# Patient Record
Sex: Male | Born: 1975 | Race: White | Hispanic: No | Marital: Single | Smoking: Former smoker
Health system: Southern US, Community
[De-identification: ages and names within clinical notes are randomized; demographics above are authoritative.]

## PROBLEM LIST (undated history)

## (undated) DIAGNOSIS — N200 Calculus of kidney: Secondary | ICD-10-CM

## (undated) DIAGNOSIS — K219 Gastro-esophageal reflux disease without esophagitis: Secondary | ICD-10-CM

## (undated) DIAGNOSIS — E785 Hyperlipidemia, unspecified: Secondary | ICD-10-CM

## (undated) DIAGNOSIS — R42 Dizziness and giddiness: Secondary | ICD-10-CM

## (undated) DIAGNOSIS — I2510 Atherosclerotic heart disease of native coronary artery without angina pectoris: Secondary | ICD-10-CM

## (undated) HISTORY — PX: CARDIAC CATHETERIZATION: SHX172

## (undated) HISTORY — PX: WISDOM TOOTH EXTRACTION: SHX21

## (undated) HISTORY — DX: Dizziness and giddiness: R42

---

## 2013-01-26 ENCOUNTER — Emergency Department (HOSPITAL_COMMUNITY)
Admission: EM | Admit: 2013-01-26 | Discharge: 2013-01-26 | Discharge status: Against Medical Advice | Payer: Self-pay | Attending: Emergency Medicine | Admitting: Emergency Medicine

## 2013-01-26 ENCOUNTER — Encounter (HOSPITAL_COMMUNITY): Payer: Self-pay | Admitting: *Deleted

## 2013-01-26 DIAGNOSIS — F17200 Nicotine dependence, unspecified, uncomplicated: Secondary | ICD-10-CM | POA: Insufficient documentation

## 2013-01-26 DIAGNOSIS — R1110 Vomiting, unspecified: Secondary | ICD-10-CM | POA: Insufficient documentation

## 2013-01-26 HISTORY — DX: Gastro-esophageal reflux disease without esophagitis: K21.9

## 2013-01-26 NOTE — ED Notes (Signed)
Call no answer 

## 2013-01-26 NOTE — ED Notes (Signed)
Call no answer 2

## 2013-01-26 NOTE — ED Notes (Signed)
Pt states he woke up vomiting and has vomited 10-12 times.  Denies abdominal pain, diarrhea, or urinary problems

## 2013-01-26 NOTE — ED Notes (Signed)
Patient refused to get blood draw her in triage.Nurse was informed.

## 2013-01-27 ENCOUNTER — Encounter (HOSPITAL_COMMUNITY): Payer: Self-pay | Admitting: *Deleted

## 2013-01-27 ENCOUNTER — Emergency Department (HOSPITAL_COMMUNITY)
Admission: EM | Admit: 2013-01-27 | Discharge: 2013-01-27 | Disposition: A | Discharge status: Home / Self Care | Payer: Self-pay | Attending: Emergency Medicine | Admitting: Emergency Medicine

## 2013-01-27 DIAGNOSIS — K219 Gastro-esophageal reflux disease without esophagitis: Secondary | ICD-10-CM | POA: Insufficient documentation

## 2013-01-27 DIAGNOSIS — K529 Noninfective gastroenteritis and colitis, unspecified: Secondary | ICD-10-CM

## 2013-01-27 DIAGNOSIS — Z79899 Other long term (current) drug therapy: Secondary | ICD-10-CM | POA: Insufficient documentation

## 2013-01-27 DIAGNOSIS — K5289 Other specified noninfective gastroenteritis and colitis: Secondary | ICD-10-CM | POA: Insufficient documentation

## 2013-01-27 DIAGNOSIS — R197 Diarrhea, unspecified: Secondary | ICD-10-CM | POA: Insufficient documentation

## 2013-01-27 MED ORDER — SODIUM CHLORIDE 0.9 % IV BOLUS (SEPSIS): 1000.0000 mL | Freq: Once | INTRAVENOUS | Status: DC

## 2013-01-27 MED ORDER — ONDANSETRON HCL 4 MG PO TABS: 4.0000 mg | ORAL_TABLET | Freq: Four times a day (QID) | ORAL | Status: DC

## 2013-01-27 MED ORDER — ONDANSETRON HCL 4 MG/2ML IJ SOLN: 4.0000 mg | Freq: Once | INTRAMUSCULAR | Status: DC

## 2013-01-27 NOTE — ED Notes (Signed)
Pt reports him and kids had n/v yesterday, he came to be seen yesterday but had to leave ama. Also having diarrhea now. No acute distress noted at this time.

## 2013-01-27 NOTE — ED Provider Notes (Signed)
History     CSN: [id number]  Arrival date & time 01/27/13  4098   First MD Initiated Contact with Patient 01/27/13 [phone]      Chief Complaint  Patient presents with  . Emesis  . Diarrhea    (Consider location/radiation/quality/duration/timing/severity/associated sxs/prior treatment) HPI  37 year old male presents complaining of nausea vomiting and diarrhea. Patient reports for the past 2 days he has developed gradual onset of nausea vomiting and diarrhea. Has had 10-12 bouts of nonbloody nonbilious vomit since yesterday which has improved. Today he has had 3 separate bouts of nonbloody non-mucous diarrhea. He experienced some uneasiness sensation in his stomach but denies any significant pain. Patient denies fever, chills, rash, numbness or weakness. He denies any lightheadedness or dizziness. No recent antibiotic use, no recent travel. States his daughter and son has the same stomach bug but appearing to be improving. He is able to tolerates fluid but afraid to eat. Has history of gastric reflux and currently taking Prilosec.  Past Medical History  Diagnosis Date  . Acid reflux     History reviewed. No pertinent past surgical history.  History reviewed. No pertinent family history.  History  Substance Use Topics  . Smoking status: Current Every Day Smoker  . Smokeless tobacco: Not on file  . Alcohol Use: No      Review of Systems  Constitutional:       A complete 10 system review of systems was obtained and all systems are negative except as noted in the HPI and PMH.    Allergies  Codeine  Home Medications   Current Outpatient Rx  Name  Route  Sig  Dispense  Refill  . omeprazole (PRILOSEC) 20 MG capsule   Oral   Take 20 mg by mouth every morning.           BP 141/99  Pulse 94  Temp(Src) 98 F (36.7 C) (Oral)  Resp 18  SpO2 100%  Physical Exam  Nursing note and vitals reviewed. Constitutional: He is oriented to person, place, and time. He appears  well-developed and well-nourished. No distress.  Awake, alert, nontoxic appearance  HENT:  Head: Atraumatic.  Mouth/Throat: Oropharynx is clear and moist.  Eyes: Conjunctivae are normal. Right eye exhibits no discharge. Left eye exhibits no discharge.  Neck: Normal range of motion. Neck supple.  Cardiovascular: Normal rate and regular rhythm.   Pulmonary/Chest: Effort normal. No respiratory distress. He exhibits no tenderness.  Abdominal: Soft. There is no tenderness. There is no rebound.  Musculoskeletal: He exhibits no tenderness.  ROM appears intact, no obvious focal weakness  Neurological: He is alert and oriented to person, place, and time.  Skin: Skin is warm and dry. No rash noted.  Psychiatric: He has a normal mood and affect.    ED Course  Procedures (including critical care time)  9:23 AM Patient has symptoms suggestive of gastroenteritis, likely viral. He is afebrile with stable normal vital sign. There is no red flags. His abdomen is nontender on exam. I offer patient IV fluid however he declined. States he would just like he checks to make sure there is no concerning finding. Patient is able to tolerates by mouth. Patient will be discharged with antinausea medication, and return precautions. All questions answered to patient's satisfaction. Patient is stable for discharge.  Labs Reviewed - No data to display No results found.   1. Gastroenteritis       MDM  BP 141/99  Pulse 94  Temp(Src) 98 F (36.7 C) (  Oral)  Resp 18  SpO2 100%  I have reviewed nursing notes and vital signs.  I reviewed available ER/hospitalization records thought the EMR         Fayrene Helper, [place] 01/27/13 4098

## 2013-01-27 NOTE — ED Provider Notes (Signed)
Medical screening examination/treatment/procedure(s) were performed by non-physician practitioner and as supervising physician I was immediately available for consultation/collaboration.   Carleene Cooper III, MD 01/27/13 2123

## 2013-03-28 ENCOUNTER — Encounter (HOSPITAL_COMMUNITY): Payer: Self-pay | Admitting: *Deleted

## 2013-03-28 ENCOUNTER — Emergency Department (HOSPITAL_COMMUNITY)
Admission: EM | Admit: 2013-03-28 | Discharge: 2013-03-28 | Disposition: A | Discharge status: Home / Self Care | Payer: Self-pay | Attending: Emergency Medicine | Admitting: Emergency Medicine

## 2013-03-28 DIAGNOSIS — F17200 Nicotine dependence, unspecified, uncomplicated: Secondary | ICD-10-CM | POA: Insufficient documentation

## 2013-03-28 DIAGNOSIS — Z79899 Other long term (current) drug therapy: Secondary | ICD-10-CM | POA: Insufficient documentation

## 2013-03-28 DIAGNOSIS — L0231 Cutaneous abscess of buttock: Secondary | ICD-10-CM | POA: Insufficient documentation

## 2013-03-28 MED ORDER — CEPHALEXIN 500 MG PO CAPS: 500.0000 mg | ORAL_CAPSULE | Freq: Two times a day (BID) | ORAL | Status: DC

## 2013-03-28 MED ORDER — SULFAMETHOXAZOLE-TRIMETHOPRIM 800-160 MG PO TABS: 2.0000 | ORAL_TABLET | Freq: Two times a day (BID) | ORAL | Status: AC

## 2013-03-28 NOTE — ED Provider Notes (Signed)
History    This chart was scribed for non-physician practitioner, Lottie Mussel, PA-C, working with Geoffery Lyons, MD by Melba Coon, ED Scribe. This patient was seen in room TR11C/TR11C and the patient's care was started at 8:49PM.   CSN: [id number]  Arrival date & time 03/28/13  2021   None     Chief Complaint  Patient presents with  . Wound Infection    (Consider location/radiation/quality/duration/timing/severity/associated sxs/prior treatment) The history is provided by the patient. No language interpreter was used.   HPI Comments: Kevin Lloyd is a 37 y.o. male who presents to the Emergency Department complaining of an abscess to the right buttock with redness and swelling with an onset 2 days ago. Nevaan reports that he only wants antibiotics and does not want an I&D; he is very anxious about this. He reports he has had a similar abscess in the past to the same area and reports it went away with warm compresses and applied pressure to help drain it. He is doing similar things to his present abscesses and reports clear drainage mixed with some blood. He reports the abscess has not gotten larger since earlier today. He is allergic to zithromax. No other pertinent medical symptoms.  Past Medical History  Diagnosis Date  . Acid reflux     No past surgical history on file.  No family history on file.  History  Substance Use Topics  . Smoking status: Current Every Day Smoker  . Smokeless tobacco: Not on file  . Alcohol Use: No      Review of Systems  Constitutional: Negative for fever and diaphoresis.  HENT: Negative for neck pain and neck stiffness.   Eyes: Negative for visual disturbance.  Respiratory: Negative for apnea, chest tightness and shortness of breath.   Cardiovascular: Negative for chest pain and palpitations.  Gastrointestinal: Negative for nausea, vomiting, diarrhea and constipation.  Genitourinary: Negative for dysuria.  Musculoskeletal:  Negative for gait problem.  Skin: Positive for wound. Negative for rash.  Neurological: Negative for dizziness, weakness, light-headedness, numbness and headaches.  All other systems reviewed and are negative.    Allergies  Codeine  Home Medications   Current Outpatient Rx  Name  Route  Sig  Dispense  Refill  . naproxen sodium (ANAPROX) 220 MG tablet   Oral   Take 660 mg by mouth daily as needed (for headache).         Marland Kitchen omeprazole (PRILOSEC) 20 MG capsule   Oral   Take 20 mg by mouth every morning.           BP 149/80  Pulse 139  Temp(Src) 98.9 F (37.2 C) (Oral)  Resp 16  SpO2 98%  Physical Exam  Nursing note and vitals reviewed. Constitutional: He is oriented to person, place, and time. He appears well-developed and well-nourished. No distress.  HENT:  Head: Normocephalic and atraumatic.  Eyes: Conjunctivae are normal.  Neck: Normal range of motion. Neck supple.  Cardiovascular: Tachycardia present.   Pulmonary/Chest: Effort normal. No respiratory distress.  Abdominal: Soft. Bowel sounds are normal. He exhibits no distension. There is no tenderness. There is no rebound and no guarding.  Musculoskeletal: Normal range of motion. He exhibits no edema and no tenderness.  Neurological: He is alert and oriented to person, place, and time.  Skin: Skin is warm and dry. He is not diaphoretic.  3x3 cm abscess to the right buttock with erythema that is spontaneously draining with mild surrounding cellulitis  Psychiatric: He has a  normal mood and affect. His behavior is normal.    ED Course  Procedures (including critical care time)  COORDINATION OF CARE:  8:53PM - I&D is recommended but he is going against medical advice and only wants antibiotics. Antibiotics will be prescribed. He is advised to f/u with ED or PCP if symptoms worsen (increased area of redness, pain, or size). He is ready for d/c.  9:05PM - HR at time of triage was 139. HR will be  rechecked.   Labs Reviewed - No data to display No results found.   1. Abscess of right buttock       MDM  Pt with right buttock abscess, draining spontaneously. Refusing I&D, strongly recommended, still refused. Will start on keflex and bactrim. Continue wound care and warm soaks at home. Follow up with pcp. Return if worsening.   Filed Vitals:   03/28/13 2110  BP: 139/88  Pulse: 81  Temp: 99.4 F (37.4 C)  Resp: 20    I personally performed the services described in this documentation, which was scribed in my presence. The recorded information has been reviewed and is accurate.        Lottie Mussel, PA-C 03/28/13 2212

## 2013-03-28 NOTE — ED Notes (Signed)
Pt c/o abscess on right buttock. Area with swelling and redness

## 2013-03-29 NOTE — ED Provider Notes (Signed)
Medical screening examination/treatment/procedure(s) were performed by non-physician practitioner and as supervising physician I was immediately available for consultation/collaboration.  Yosiel Thieme, MD 03/29/13 0817 

## 2013-11-26 DIAGNOSIS — Z0000 Encounter for general adult medical examination without abnormal findings: Secondary | ICD-10-CM | POA: Insufficient documentation

## 2014-10-23 ENCOUNTER — Encounter (HOSPITAL_COMMUNITY): Payer: Self-pay | Admitting: Emergency Medicine

## 2014-10-23 ENCOUNTER — Emergency Department (HOSPITAL_COMMUNITY)
Admission: EM | Admit: 2014-10-23 | Discharge: 2014-10-23 | Disposition: A | Discharge status: Home / Self Care | Payer: Self-pay | Attending: Emergency Medicine | Admitting: Emergency Medicine

## 2014-10-23 ENCOUNTER — Emergency Department (HOSPITAL_COMMUNITY): Payer: Self-pay

## 2014-10-23 DIAGNOSIS — Z720 Tobacco use: Secondary | ICD-10-CM | POA: Insufficient documentation

## 2014-10-23 DIAGNOSIS — Y998 Other external cause status: Secondary | ICD-10-CM | POA: Insufficient documentation

## 2014-10-23 DIAGNOSIS — R52 Pain, unspecified: Secondary | ICD-10-CM

## 2014-10-23 DIAGNOSIS — W1839XA Other fall on same level, initial encounter: Secondary | ICD-10-CM | POA: Insufficient documentation

## 2014-10-23 DIAGNOSIS — K219 Gastro-esophageal reflux disease without esophagitis: Secondary | ICD-10-CM | POA: Insufficient documentation

## 2014-10-23 DIAGNOSIS — Z791 Long term (current) use of non-steroidal anti-inflammatories (NSAID): Secondary | ICD-10-CM | POA: Insufficient documentation

## 2014-10-23 DIAGNOSIS — Y9389 Activity, other specified: Secondary | ICD-10-CM | POA: Insufficient documentation

## 2014-10-23 DIAGNOSIS — Y9289 Other specified places as the place of occurrence of the external cause: Secondary | ICD-10-CM | POA: Insufficient documentation

## 2014-10-23 DIAGNOSIS — S4351XA Sprain of right acromioclavicular joint, initial encounter: Secondary | ICD-10-CM | POA: Insufficient documentation

## 2014-10-23 DIAGNOSIS — Z79899 Other long term (current) drug therapy: Secondary | ICD-10-CM | POA: Insufficient documentation

## 2014-10-23 MED ORDER — OXYCODONE-ACETAMINOPHEN 5-325 MG PO TABS
2.0000 | ORAL_TABLET | Freq: Once | ORAL | Status: AC
  Administered 2014-10-23: 2 via ORAL
  Filled 2014-10-23: qty 2

## 2014-10-23 MED ORDER — OXYCODONE-ACETAMINOPHEN 5-325 MG PO TABS: 1.0000 | ORAL_TABLET | ORAL | Status: DC | PRN

## 2014-10-23 MED ORDER — IBUPROFEN 200 MG PO TABS
600.0000 mg | ORAL_TABLET | Freq: Once | ORAL | Status: AC
  Administered 2014-10-23: 600 mg via ORAL
  Filled 2014-10-23: qty 3

## 2014-10-23 NOTE — ED Notes (Signed)
Pt reports that he fell, injuring his R shoulder. Pt noted to have R shoulder lower than his R, decreased movement to R arm noted. Pt has +warmth, color, sensation and pulse to R hand and fingers but reports tingling in his ring and pinky finger. Pt a&ox4, no LOC.

## 2014-10-23 NOTE — Discharge Instructions (Signed)
Acromioclavicular Injuries °The AC (acromioclavicular) joint is the joint in the shoulder where the collarbone (clavicle) meets the shoulder blade (scapula). The part of the shoulder blade connected to the collarbone is called the acromion. Common problems with and treatments for the AC joint are detailed below. °ARTHRITIS °Arthritis occurs when the joint has been injured and the smooth padding between the joints (cartilage) is lost. This is the wear and tear seen in most joints of the body if they have been overused. This causes the joint to produce pain and swelling which is worse with activity.  °AC JOINT SEPARATION °AC joint separation means that the ligaments connecting the acromion of the shoulder blade and collarbone have been damaged, and the two bones no longer line up. AC separations can be anywhere from mild to severe, and are "graded" depending upon which ligaments are torn and how badly they are torn. °· Grade I Injury: the least damage is done, and the AC joint still lines up. °· Grade II Injury: damage to the ligaments which reinforce the AC joint. In a Grade II injury, these ligaments are stretched but not entirely torn. When stressed, the AC joint becomes painful and unstable. °· Grade III Injury: AC and secondary ligaments are completely torn, and the collarbone is no longer attached to the shoulder blade. This results in deformity; a prominence of the end of the clavicle. °AC JOINT FRACTURE °AC joint fracture means that there has been a break in the bones of the AC joint, usually the end of the clavicle. °TREATMENT °TREATMENT OF AC ARTHRITIS °· There is currently no way to replace the cartilage damaged by arthritis. The best way to improve the condition is to decrease the activities which aggravate the problem. Application of ice to the joint helps decrease pain and soreness (inflammation). The use of non-steroidal anti-inflammatory medication is helpful. °· If less conservative measures do not  work, then cortisone shots (injections) may be used. These are anti-inflammatories; they decrease the soreness in the joint and swelling. °· If non-surgical measures fail, surgery may be recommended. The procedure is generally removal of a portion of the end of the clavicle. This is the part of the collarbone closest to your acromion which is stabilized with ligaments to the acromion of the shoulder blade. This surgery may be performed using a tube-like instrument with a light (arthroscope) for looking into a joint. It may also be performed as an open surgery through a small incision by the surgeon. Most patients will have good range of motion within 6 weeks and may return to all activity including sports by 8-12 weeks, barring complications. °TREATMENT OF AN AC SEPARATION °· The initial treatment is to decrease pain. This is best accomplished by immobilizing the arm in a sling and placing an ice pack to the shoulder for 20 to 30 minutes every 2 hours as needed. As the pain starts to subside, it is important to begin moving the fingers, wrist, elbow and eventually the shoulder in order to prevent a stiff or "frozen" shoulder. Instruction on when and how much to move the shoulder will be provided by your caregiver. The length of time needed to regain full motion and function depends on the amount or grade of the injury. Recovery from a Grade I AC separation usually takes 10 to 14 days, whereas a Grade III may take 6 to 8 weeks. °· Grade I and II separations usually do not require surgery. Even Grade III injuries usually allow return to full   activity with few restrictions. Treatment is also based on the activity demands of the injured shoulder. For example, a high level quarterback with an injured throwing arm will receive more aggressive treatment than someone with a desk job who rarely uses his/her arm for strenuous activities. In some cases, a painful lump may persist which could require a later surgery. Surgery  can be very successful, but the benefits must be weighed against the potential risks. °TREATMENT OF AN AC JOINT FRACTURE °Fracture treatment depends on the type of fracture. Sometimes a splint or sling may be all that is required. Other times surgery may be required for repair. This is more frequently the case when the ligaments supporting the clavicle are completely torn. Your caregiver will help you with these decisions and together you can decide what will be the best treatment. °HOME CARE INSTRUCTIONS  °· Apply ice to the injury for 15-20 minutes each hour while awake for 2 days. Put the ice in a plastic bag and place a towel between the bag of ice and skin. °· If a sling has been applied, wear it constantly for as long as directed by your caregiver, even at night. The sling or splint can be removed for bathing or showering or as directed. Be sure to keep the shoulder in the same place as when the sling is on. Do not lift the arm. °· If a figure-of-eight splint has been applied it should be tightened gently by another person every day. Tighten it enough to keep the shoulders held back. Allow enough room to place the index finger between the body and strap. Loosen the splint immediately if there is numbness or tingling in the hands. °· Take over-the-counter or prescription medicines for pain, discomfort or fever as directed by your caregiver. °· If you or your child has received a follow up appointment, it is very important to keep that appointment in order to avoid long term complications, chronic pain or disability. °SEEK MEDICAL CARE IF:  °· The pain is not relieved with medications. °· There is increased swelling or discoloration that continues to get worse rather than better. °· You or your child has been unable to follow up as instructed. °· There is progressive numbness and tingling in the arm, forearm or hand. °SEEK IMMEDIATE MEDICAL CARE IF:  °· The arm is numb, cold or pale. °· There is increasing pain  in the hand, forearm or fingers. °MAKE SURE YOU:  °· Understand these instructions. °· Will watch your condition. °· Will get help right away if you are not doing well or get worse. °Document Released: 07/24/2005 Document Revised: 01/06/2012 Document Reviewed: 01/16/2009 °[employer]® Patient Information ©2015 [employer]. This information is not intended to replace advice given to you by your health care provider. Make sure you discuss any questions you have with your health care provider. ° °

## 2014-10-29 NOTE — ED Provider Notes (Addendum)
CSN: [id number]     Arrival date & time 10/23/14  2211 History   First MD Initiated Contact with Patient 10/23/14 2229     Chief Complaint  Patient presents with  . Shoulder Injury     (Consider location/radiation/quality/duration/timing/severity/associated sxs/prior Treatment) HPI   39 six-year-old male with right shoulder pain. Patient fell just before arrival. He fell onto his right side. Persistent pain in his right shoulder since then. Increase with range of motion. Denies significant pain anywhere else. Some tingling sensation intermittently in his ring and pinky fingers. No elbow pain. No intervention prior to arrival.  Past Medical History  Diagnosis Date  . Acid reflux    History reviewed. No pertinent past surgical history. History reviewed. No pertinent family history. History  Substance Use Topics  . Smoking status: Current Every Day Smoker -- 1.00 packs/day    Types: Cigarettes  . Smokeless tobacco: Never Used  . Alcohol Use: No    Review of Systems  All systems reviewed and negative, other than as noted in HPI.   Allergies  Codeine and Azithromycin  Home Medications   Prior to Admission medications   Medication Sig Start Date End Date Taking? Authorizing Provider  cephALEXin (KEFLEX) 500 MG capsule Take 1 capsule (500 mg total) by mouth 2 (two) times daily. Patient not taking: Reported on 10/23/2014 03/28/13   Tatyana A Kirichenko, PA-C  naproxen sodium (ANAPROX) 220 MG tablet Take 660 mg by mouth daily as needed (for headache).    Historical Provider, MD  omeprazole (PRILOSEC) 20 MG capsule Take 20 mg by mouth every morning.    Historical Provider, MD  oxyCODONE-acetaminophen (PERCOCET/ROXICET) 5-325 MG per tablet Take 1-2 tablets by mouth every 4 (four) hours as needed for severe pain. 10/23/14   Raeford Razor, MD   BP 128/92 mmHg  Pulse 88  Temp(Src) 98 F (36.7 C) (Oral)  Resp 18  Ht 5' 9.5" (1.765 m)  Wt 205 lb (92.987 kg)  BMI 29.85 kg/m2   SpO2 100% Physical Exam  Constitutional: He appears well-developed and well-nourished. No distress.  HENT:  Head: Normocephalic and atraumatic.  Eyes: Conjunctivae are normal. Right eye exhibits no discharge. Left eye exhibits no discharge.  Neck: Neck supple.  Cardiovascular: Normal rate, regular rhythm and normal heart sounds.  Exam reveals no gallop and no friction rub.   No murmur heard. Pulmonary/Chest: Effort normal and breath sounds normal. No respiratory distress.  Abdominal: Soft. He exhibits no distension. There is no tenderness.  Musculoskeletal: He exhibits tenderness. He exhibits no edema.  Right shoulder is symmetric as compared to the left. No deformity appreciated. Patient has exquisite point tenderness over the right before meals joint. Can actively range her shoulder although with increased pain. Neurovascularly intact distally.  Neurological: He is alert.  Skin: Skin is warm and dry.  Psychiatric: He has a normal mood and affect. His behavior is normal. Thought content normal.  Nursing note and vitals reviewed.   ED Course  Procedures (including critical care time) Labs Review Labs Reviewed - No data to display  Imaging Review No results found.   Dg Shoulder Right  10/23/2014   CLINICAL DATA:  Fall from 3 foot ladder today. Fell on the right shoulder. Right shoulder and neck pain.  EXAM: RIGHT SHOULDER - 2+ VIEW  COMPARISON:  None.  FINDINGS: There is no evidence of fracture or dislocation. There is no evidence of arthropathy or other focal bone abnormality. Soft tissues are unremarkable.  IMPRESSION: Negative.  Electronically Signed   By: Charlett Nose M.D.   On: 10/23/2014 23:05    EKG Interpretation None      MDM   Final diagnoses:  Pain  Acromioclavicular sprain, right, initial encounter    39 year old male with right shoulder pain. Point tenderness over the right before meals joint. X-ray without any acute abnormality. Plan symptomatically treatment  as needed pain medication rest and sling. Return precautions were discussed. Orthopedic follow-up for continued symptoms.      Raeford Razor, MD 10/29/14 [phone]

## 2014-11-05 ENCOUNTER — Emergency Department (HOSPITAL_BASED_OUTPATIENT_CLINIC_OR_DEPARTMENT_OTHER)
Admission: EM | Admit: 2014-11-05 | Discharge: 2014-11-05 | Disposition: A | Discharge status: Home / Self Care | Payer: Self-pay

## 2014-11-05 NOTE — ED Notes (Signed)
Did not need to check-in, only needed note

## 2014-11-06 ENCOUNTER — Emergency Department (HOSPITAL_BASED_OUTPATIENT_CLINIC_OR_DEPARTMENT_OTHER)
Admission: EM | Admit: 2014-11-06 | Discharge: 2014-11-06 | Disposition: A | Discharge status: Home / Self Care | Payer: Self-pay | Attending: Emergency Medicine | Admitting: Emergency Medicine

## 2014-11-06 ENCOUNTER — Emergency Department (HOSPITAL_BASED_OUTPATIENT_CLINIC_OR_DEPARTMENT_OTHER): Payer: Self-pay

## 2014-11-06 ENCOUNTER — Encounter (HOSPITAL_BASED_OUTPATIENT_CLINIC_OR_DEPARTMENT_OTHER): Payer: Self-pay | Admitting: Emergency Medicine

## 2014-11-06 DIAGNOSIS — K219 Gastro-esophageal reflux disease without esophagitis: Secondary | ICD-10-CM | POA: Insufficient documentation

## 2014-11-06 DIAGNOSIS — Z792 Long term (current) use of antibiotics: Secondary | ICD-10-CM | POA: Insufficient documentation

## 2014-11-06 DIAGNOSIS — Z791 Long term (current) use of non-steroidal anti-inflammatories (NSAID): Secondary | ICD-10-CM | POA: Insufficient documentation

## 2014-11-06 DIAGNOSIS — M25511 Pain in right shoulder: Secondary | ICD-10-CM | POA: Insufficient documentation

## 2014-11-06 DIAGNOSIS — R52 Pain, unspecified: Secondary | ICD-10-CM

## 2014-11-06 DIAGNOSIS — Z720 Tobacco use: Secondary | ICD-10-CM | POA: Insufficient documentation

## 2014-11-06 MED ORDER — IBUPROFEN 800 MG PO TABS
800.0000 mg | ORAL_TABLET | Freq: Once | ORAL | Status: AC
  Administered 2014-11-06: 800 mg via ORAL
  Filled 2014-11-06: qty 1

## 2014-11-06 NOTE — ED Notes (Addendum)
Patient here yesterday requesting note to return to work. PA assessed at triage and patient denied all pain with ROM and palpation.  Decided he did not need to be treated. Note provided to return to work.

## 2014-11-06 NOTE — Discharge Instructions (Signed)
Shoulder Sprain °A shoulder sprain is the result of damage to the tough, fiber-like tissues (ligaments) that help hold your shoulder in place. The ligaments may be stretched or torn. Besides the main shoulder joint (the ball and socket), there are several smaller joints that connect the bones in this area. A sprain usually involves one of those joints. Most often it is the acromioclavicular (or AC) joint. That is the joint that connects the collarbone (clavicle) and the shoulder blade (scapula) at the top point of the shoulder blade (acromion). °A shoulder sprain is a mild form of what is called a shoulder separation. Recovering from a shoulder sprain may take some time. For some, pain lingers for several months. Most people recover without long term problems. °CAUSES  °· A shoulder sprain is usually caused by some kind of trauma. This might be: °¨ Falling on an outstretched arm. °¨ Being hit hard on the shoulder. °¨ Twisting the arm. °· Shoulder sprains are more likely to occur in people who: °¨ Play sports. °¨ Have balance or coordination problems. °SYMPTOMS  °· Pain when you move your shoulder. °· Limited ability to move the shoulder. °· Swelling and tenderness on top of the shoulder. °· Redness or warmth in the shoulder. °· Bruising. °· A change in the shape of the shoulder. °DIAGNOSIS  °Your healthcare provider may: °· Ask about your symptoms. °· Ask about recent activity that might have caused those symptoms. °· Examine your shoulder. You may be asked to do simple exercises to test movement. The other shoulder will be examined for comparison. °· Order some tests that provide a look inside the body. They can show the extent of the injury. The tests could include: °¨ X-rays. °¨ CT (computed tomography) scan. °¨ MRI (magnetic resonance imaging) scan. °RISKS AND COMPLICATIONS °· Loss of full shoulder motion. °· Ongoing shoulder pain. °TREATMENT  °How long it takes to recover from a shoulder sprain depends on how  severe it was. Treatment options may include: °· Rest. You should not use the arm or shoulder until it heals. °· Ice. For 2 or 3 days after the injury, put an ice pack on the shoulder up to 4 times a day. It should stay on for 15 to 20 minutes each time. Wrap the ice in a towel so it does not touch your skin. °· Over-the-counter medicine to relieve pain. °· A sling or brace. This will keep the arm still while the shoulder is healing. °· Physical therapy or rehabilitation exercises. These will help you regain strength and motion. Ask your healthcare provider when it is OK to begin these exercises. °· Surgery. The need for surgery is rare with a sprained shoulder, but some people may need surgery to keep the joint in place and reduce pain. °HOME CARE INSTRUCTIONS  °· Ask your healthcare provider about what you should and should not do while your shoulder heals. °· Make sure you know how to apply ice to the correct area of your shoulder. °· Talk with your healthcare provider about which medications should be used for pain and swelling. °· If rehabilitation therapy will be needed, ask your healthcare provider to refer you to a therapist. If it is not recommended, then ask about at-home exercises. Find out when exercise should begin. °SEEK MEDICAL CARE IF:  °Your pain, swelling, or redness at the joint increases. °SEEK IMMEDIATE MEDICAL CARE IF:  °· You have a fever. °· You cannot move your arm or shoulder. °Document Released: 03/02/2009 Document   Revised: 01/06/2012 Document Reviewed: 03/02/2009 °[employer]® Patient Information ©2015 [employer]. This information is not intended to replace advice given to you by your health care provider. Make sure you discuss any questions you have with your health care provider. ° °

## 2014-11-06 NOTE — ED Provider Notes (Signed)
CSN: [id number]     Arrival date & time 11/06/14  1138 History   First MD Initiated Contact with Patient 11/06/14 1254     Chief Complaint  Patient presents with  . Shoulder Injury     (Consider location/radiation/quality/duration/timing/severity/associated sxs/prior Treatment) HPI 39 year old male with recent shoulder injury at [facility] ED January 4. He returned here yesterday to obtain a note to go back to work. He states that last night at work he reinjured his right shoulder while getting something down from overhead. He did not have any direct trauma. Is complaining of pain in the anterior aspect of the right shoulder. He denies a numbness or tingling. He has some pain that radiates up towards his neck. He denies any weakness. He has not taken anything for this pain. He did not receive a prior referral and has not followed up regarding this in the past. Past Medical History  Diagnosis Date  . Acid reflux    History reviewed. No pertinent past surgical history. No family history on file. History  Substance Use Topics  . Smoking status: Current Every Day Smoker -- 1.00 packs/day    Types: Cigarettes  . Smokeless tobacco: Never Used  . Alcohol Use: No    Review of Systems  All other systems reviewed and are negative.     Allergies  Codeine and Azithromycin  Home Medications   Prior to Admission medications   Medication Sig Start Date End Date Taking? Authorizing Provider  cephALEXin (KEFLEX) 500 MG capsule Take 1 capsule (500 mg total) by mouth 2 (two) times daily. Patient not taking: Reported on 10/23/2014 03/28/13   Tatyana A Kirichenko, PA-C  naproxen sodium (ANAPROX) 220 MG tablet Take 660 mg by mouth daily as needed (for headache).    Historical Provider, MD  omeprazole (PRILOSEC) 20 MG capsule Take 20 mg by mouth every morning.    Historical Provider, MD  oxyCODONE-acetaminophen (PERCOCET/ROXICET) 5-325 MG per tablet Take 1-2 tablets by mouth every 4 (four) hours  as needed for severe pain. 10/23/14   Raeford Razor, MD   BP 123/92 mmHg  Pulse 98  Temp(Src) 98.4 F (36.9 C) (Oral)  Ht 5' 9.5" (1.765 m)  Wt 205 lb (92.987 kg)  BMI 29.85 kg/m2  SpO2 100% Physical Exam  Constitutional: He is oriented to person, place, and time. He appears well-developed and well-nourished.  HENT:  Head: Normocephalic and atraumatic.  Right Ear: External ear normal.  Left Ear: External ear normal.  Nose: Nose normal.  Mouth/Throat: Oropharynx is clear and moist.  Eyes: Conjunctivae are normal. Pupils are equal, round, and reactive to light.  Neck: Normal range of motion. Neck supple.  Cardiovascular: Normal rate and regular rhythm.   Pulmonary/Chest: Effort normal. No respiratory distress. He has no wheezes. He exhibits no tenderness.  Abdominal: Soft. He exhibits no distension and no mass. There is no tenderness. There is no guarding.  Musculoskeletal: Normal range of motion.  Right shoulder with some tenderness to palpation anterior aspect. Patient is holding arm tray down to side. He has some decreased external rotation abduction. Sensation is intact, pulses are intact. No tenderness is noted over a c joint.  Neurological: He is alert and oriented to person, place, and time. He has normal reflexes. He exhibits normal muscle tone. Coordination normal.  Skin: Skin is warm and dry.  Psychiatric: He has a normal mood and affect. His behavior is normal. Judgment and thought content normal.  Nursing note and vitals reviewed.   ED  Course  Procedures (including critical care time) Labs Review Labs Reviewed - No data to display  Imaging Review No results found.   EKG Interpretation None      MDM   Final diagnoses:  Right shoulder pain        Hilario Quarryanielle S Hansford Hirt, MD 11/06/14 [phone]

## 2014-11-06 NOTE — ED Notes (Addendum)
Pt presents to ED with complaints of right shoulder pain after lifted boxes at work last night.  When pt was here last night PA palpated right shoulder pt states to this RN at this time " it did hurt when she pushed on it but I told her it did not because I wanted to go back to work".

## 2014-11-08 ENCOUNTER — Encounter: Payer: Self-pay | Admitting: Family Medicine

## 2014-11-08 ENCOUNTER — Ambulatory Visit (INDEPENDENT_AMBULATORY_CARE_PROVIDER_SITE_OTHER): Payer: Self-pay | Admitting: Family Medicine

## 2014-11-08 VITALS — BP 116/83 | HR 81 | Ht 69.0 in | Wt 205.0 lb

## 2014-11-08 DIAGNOSIS — S4991XA Unspecified injury of right shoulder and upper arm, initial encounter: Secondary | ICD-10-CM

## 2014-11-08 MED ORDER — OXYCODONE-ACETAMINOPHEN 5-325 MG PO TABS: 1.0000 | ORAL_TABLET | Freq: Four times a day (QID) | ORAL | Status: DC | PRN

## 2014-11-08 NOTE — Patient Instructions (Addendum)
You have an AC sprain and likely shoulder subluxation. Use sling regularly for next 2 weeks. Icing 15 minutes at a time 3-4 times a day. Percocet as needed for severe pain (no driving on this). Aleve 2 tabs twice a day with food until I see you back. Out of work in the meantime. If you feel pretty good you can return in 1 week or you can start the home exercises I showed you. Follow up with me in 2 weeks.

## 2014-11-10 ENCOUNTER — Encounter: Payer: Self-pay | Admitting: Family Medicine

## 2014-11-10 DIAGNOSIS — S4991XA Unspecified injury of right shoulder and upper arm, initial encounter: Secondary | ICD-10-CM | POA: Insufficient documentation

## 2014-11-10 NOTE — Progress Notes (Signed)
PCP: Default, Provider, MD  Subjective:   HPI: Patient is a 39 y.o. male here for right shoulder injury.  Patient reports on 10/24/14 he was taking down christmas lights when he fell from about 3 feet and landed directly on his right shoulder. Had superior pain of this shoulder at that time. Tried to work through this and went back to work. 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 Also hurt when he was bringing down a tall iron/wood table and tried to catch this from falling forward. No feels unable to use this arm. Shoulder radiographs negative in ED. Has been taking ibuprofen. Given a sling but not using currently. No prior injuries.  Past Medical History  Diagnosis Date  . Acid reflux     No current outpatient prescriptions on file prior to visit.   No current facility-administered medications on file prior to visit.    Past Surgical History  Procedure Laterality Date  . Wisdom tooth extraction      Allergies  Allergen Reactions  . Codeine Anaphylaxis    Childhood allergy  . Azithromycin Other (See Comments)    Caused his tongue to breakout with bones    History   Social History  . Marital Status: Single    Spouse Name: N/A    Number of Children: N/A  . Years of Education: N/A   Occupational History  . Not on file.   Social History Main Topics  . Smoking status: Current Every Day Smoker -- 1.00 packs/day    Types: Cigarettes  . Smokeless tobacco: Never Used  . Alcohol Use: No  . Drug Use: No  . Sexual Activity: Not on file   Other Topics Concern  . Not on file   Social History Narrative    No family history on file.  BP 116/83 mmHg  Pulse 81  Ht 5\' 9"  (1.753 m)  Wt 205 lb (92.987 kg)  BMI 30.26 kg/m2  Review of Systems: See HPI above.    Objective:  Physical Exam:  Gen: NAD  Right shoulder: No swelling, ecchymoses.  No gross deformity. TTP AC joint.  No other  tenderness. Very limited motion - 30 degrees ER, flexion, abduction - limited by pain. Pain when attempting Ananias PilgrimHawkins, Neers. Negative Yergasons. Strength 5/5 with resisted internal/external rotation.  Cannot test empty can. NV intact distally.    Assessment & Plan:  1. Right shoulder injuries - consistent with AC sprain (grade 1 or 2 based on radiographs) and shoulder subluxation with recent injuries at work.  Start with rest - sling, icing with percocet and aleve.  Out of work in meantime.  F/u in 2 weeks.  Showed him codman exercises he could start doing.

## 2014-11-10 NOTE — Assessment & Plan Note (Signed)
consistent with AC sprain (grade 1 or 2 based on radiographs) and shoulder subluxation with recent injuries at work.  Start with rest - sling, icing with percocet and aleve.  Out of work in meantime.  F/u in 2 weeks.  Showed him codman exercises he could start doing.

## 2014-11-22 ENCOUNTER — Encounter (INDEPENDENT_AMBULATORY_CARE_PROVIDER_SITE_OTHER): Payer: Self-pay

## 2014-11-22 ENCOUNTER — Ambulatory Visit (INDEPENDENT_AMBULATORY_CARE_PROVIDER_SITE_OTHER): Payer: Self-pay | Admitting: Family Medicine

## 2014-11-22 ENCOUNTER — Encounter: Payer: Self-pay | Admitting: Family Medicine

## 2014-11-22 VITALS — BP 130/86 | HR 82 | Ht 70.0 in | Wt 205.0 lb

## 2014-11-22 DIAGNOSIS — S4991XD Unspecified injury of right shoulder and upper arm, subsequent encounter: Secondary | ICD-10-CM

## 2014-11-22 NOTE — Patient Instructions (Signed)
You are improving as expected. Start theraband strengthening exercises 3 sets of 10 once a day. Avoid overhead lifting, a lot of reaching, more than 15 pounds of lifting as well. Light duty as written in letter. Follow up with me in 2-4 weeks as discussed.

## 2014-11-23 NOTE — Assessment & Plan Note (Signed)
consistent with AC sprain (grade 1 or 2 based on radiographs) and shoulder subluxation with recent injuries at work.  Much improved with conservative measures to date.  Will add theraband strengthening once a day.  Restrictions at work - weight restriction, no overhead lifting, see letter.  F/u in 2-4 weeks (as early as 2 weeks if completely improved).

## 2014-11-23 NOTE — Progress Notes (Signed)
PCP: Default, Provider, MD  Subjective:   HPI: Patient is a 39 y.o. male here for right shoulder injury.  1/12: Patient reports on 10/24/14 he was taking down christmas lights when he fell from about 3 feet and landed directly on his right shoulder. Had superior pain of this shoulder at that time. Tried to work through this and went back to work. 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 Also hurt when he was bringing down a tall iron/wood table and tried to catch this from falling forward. No feels unable to use this arm. Shoulder radiographs negative in ED. Has been taking ibuprofen. Given a sling but not using currently. No prior injuries.  1/26: Patient reports he feels better. Motion has improved and pain now only with certain movements. Doing home exercises. No longer using sling. + night pain.  Past Medical History  Diagnosis Date  . Acid reflux     Current Outpatient Prescriptions on File Prior to Visit  Medication Sig Dispense Refill  . oxyCODONE-acetaminophen (PERCOCET/ROXICET) 5-325 MG per tablet Take 1 tablet by mouth every 6 (six) hours as needed for severe pain. 60 tablet 0   No current facility-administered medications on file prior to visit.    Past Surgical History  Procedure Laterality Date  . Wisdom tooth extraction      Allergies  Allergen Reactions  . Codeine Anaphylaxis    Childhood allergy  . Azithromycin Other (See Comments)    Caused his tongue to breakout with bones    History   Social History  . Marital Status: Single    Spouse Name: N/A    Number of Children: N/A  . Years of Education: N/A   Occupational History  . Not on file.   Social History Main Topics  . Smoking status: Current Every Day Smoker -- 1.00 packs/day    Types: Cigarettes  . Smokeless tobacco: Never Used  . Alcohol Use: No  . Drug Use: No  . Sexual Activity: Not on file   Other Topics  Concern  . Not on file   Social History Narrative    No family history on file.  BP 130/86 mmHg  Pulse 82  Ht 5\' 10"  (1.778 m)  Wt 205 lb (92.987 kg)  BMI 29.41 kg/m2  Review of Systems: See HPI above.    Objective:  Physical Exam:  Gen: NAD  Right shoulder: No swelling, ecchymoses.  No gross deformity. Mild TTP AC joint.  No other tenderness. FROM - significantly improved. Mild pain with hawkins, neers Negative Yergasons. Strength 5/5 with resisted internal/external rotation, empty can. NV intact distally.    Assessment & Plan:  1. Right shoulder injuries - consistent with AC sprain (grade 1 or 2 based on radiographs) and shoulder subluxation with recent injuries at work.  Much improved with conservative measures to date.  Will add theraband strengthening once a day.  Restrictions at work - weight restriction, no overhead lifting, see letter.  F/u in 2-4 weeks (as early as 2 weeks if completely improved).

## 2015-08-07 ENCOUNTER — Emergency Department (HOSPITAL_BASED_OUTPATIENT_CLINIC_OR_DEPARTMENT_OTHER)
Admission: EM | Admit: 2015-08-07 | Discharge: 2015-08-07 | Disposition: A | Discharge status: Home / Self Care | Payer: Self-pay | Attending: Emergency Medicine | Admitting: Emergency Medicine

## 2015-08-07 ENCOUNTER — Encounter (HOSPITAL_BASED_OUTPATIENT_CLINIC_OR_DEPARTMENT_OTHER): Payer: Self-pay | Admitting: *Deleted

## 2015-08-07 DIAGNOSIS — Z720 Tobacco use: Secondary | ICD-10-CM | POA: Insufficient documentation

## 2015-08-07 DIAGNOSIS — K0381 Cracked tooth: Secondary | ICD-10-CM | POA: Insufficient documentation

## 2015-08-07 DIAGNOSIS — K029 Dental caries, unspecified: Secondary | ICD-10-CM | POA: Insufficient documentation

## 2015-08-07 DIAGNOSIS — K002 Abnormalities of size and form of teeth: Secondary | ICD-10-CM | POA: Insufficient documentation

## 2015-08-07 DIAGNOSIS — K047 Periapical abscess without sinus: Secondary | ICD-10-CM

## 2015-08-07 MED ORDER — OXYCODONE-ACETAMINOPHEN 5-325 MG PO TABS: 2.0000 | ORAL_TABLET | ORAL | Status: DC | PRN

## 2015-08-07 MED ORDER — AMOXICILLIN-POT CLAVULANATE 875-125 MG PO TABS: 1.0000 | ORAL_TABLET | Freq: Two times a day (BID) | ORAL | Status: DC

## 2015-08-07 NOTE — Discharge Instructions (Signed)
Dental Care and Dentist Visits Dental care supports good overall health. Regular dental visits can also help you avoid dental pain, bleeding, infection, and other more serious health problems in the future. It is important to keep the mouth healthy because diseases in the teeth, gums, and other oral tissues can spread to other areas of the body. Some problems, such as diabetes, heart disease, and pre-term labor have been associated with poor oral health.  See your dentist every 6 months. If you experience emergency problems such as a toothache or broken tooth, go to the dentist right away. If you see your dentist regularly, you may catch problems early. It is easier to be treated for problems in the early stages.  WHAT TO EXPECT AT A DENTIST VISIT  Your dentist will look for many common oral health problems and recommend proper treatment. At your regular dental visit, you can expect:  Gentle cleaning of the teeth and gums. This includes scraping and polishing. This helps to remove the sticky substance around the teeth and gums (plaque). Plaque forms in the mouth shortly after eating. Over time, plaque hardens on the teeth as tartar. If tartar is not removed regularly, it can cause problems. Cleaning also helps remove stains.  Periodic X-rays. These pictures of the teeth and supporting bone will help your dentist assess the health of your teeth.  Periodic fluoride treatments. Fluoride is a natural mineral shown to help strengthen teeth. Fluoride treatmentinvolves applying a fluoride gel or varnish to the teeth. It is most commonly done in children.  Examination of the mouth, tongue, jaws, teeth, and gums to look for any oral health problems, such as:  Cavities (dental caries). This is decay on the tooth caused by plaque, sugar, and acid in the mouth. It is best to catch a cavity when it is small.  Inflammation of the gums caused by plaque buildup (gingivitis).  Problems with the mouth or malformed  or misaligned teeth.  Oral cancer or other diseases of the soft tissues or jaws. KEEP YOUR TEETH AND GUMS HEALTHY For healthy teeth and gums, follow these general guidelines as well as your dentist's specific advice:  Have your teeth professionally cleaned at the dentist every 6 months.  Brush twice daily with a fluoride toothpaste.  Floss your teeth daily.  Ask your dentist if you need fluoride supplements, treatments, or fluoride toothpaste.  Eat a healthy diet. Reduce foods and drinks with added sugar.  Avoid smoking. TREATMENT FOR ORAL HEALTH PROBLEMS If you have oral health problems, treatment varies depending on the conditions present in your teeth and gums.  Your caregiver will most likely recommend good oral hygiene at each visit.  For cavities, gingivitis, or other oral health disease, your caregiver will perform a procedure to treat the problem. This is typically done at a separate appointment. Sometimes your caregiver will refer you to another dental specialist for specific tooth problems or for surgery. SEEK IMMEDIATE DENTAL CARE IF:  You have pain, bleeding, or soreness in the gum, tooth, jaw, or mouth area.  A permanent tooth becomes loose or separated from the gum socket.  You experience a blow or injury to the mouth or jaw area.   This information is not intended to replace advice given to you by your health care provider. Make sure you discuss any questions you have with your health care provider.   Follow-up with dentist as soon as possible for tooth extraction. Return to the emergency department if you expands worsening of  your symptoms, difficulty breathing, difficulty swallowing, fever

## 2015-08-07 NOTE — ED Notes (Signed)
Right molar pain x 4 days.

## 2015-08-08 NOTE — ED Provider Notes (Signed)
CSN: [id number]     Arrival date & time 08/07/15  1937 History   First MD Initiated Contact with Patient 08/07/15 2054     Chief Complaint  Patient presents with  . Dental Pain     (Consider location/radiation/quality/duration/timing/severity/associated sxs/prior Treatment) HPI  Kevin Lloyd is a 39 y.o M with no significant pmhx who presents to the Ed with dental pain for 3 days. Pt states that his R upper molar broke off a couple of months ago. Pt has not seen a dentist due to lack of insurance. Denies fever, chills, facial swelling, difficulty swallowing or breathing. Past Medical History  Diagnosis Date  . Acid reflux    Past Surgical History  Procedure Laterality Date  . Wisdom tooth extraction     No family history on file. Social History  Substance Use Topics  . Smoking status: Current Every Day Smoker -- 1.00 packs/day    Types: Cigarettes  . Smokeless tobacco: Never Used  . Alcohol Use: No    Review of Systems  All other systems reviewed and are negative.     Allergies  Codeine and Azithromycin  Home Medications   Prior to Admission medications   Medication Sig Start Date End Date Taking? Authorizing Provider  amoxicillin-clavulanate (AUGMENTIN) 875-125 MG tablet Take 1 tablet by mouth every 12 (twelve) hours. 08/07/15   Delos Klich Tripp Tonnette Zwiebel, PA-C  oxyCODONE-acetaminophen (PERCOCET/ROXICET) 5-325 MG per tablet Take 1 tablet by mouth every 6 (six) hours as needed for severe pain. 11/08/14   Lenda Kelp, MD  oxyCODONE-acetaminophen (PERCOCET/ROXICET) 5-325 MG tablet Take 2 tablets by mouth every 4 (four) hours as needed for severe pain. 08/07/15   Lottie Siska Tripp Trey Bebee, PA-C   BP 153/95 mmHg  Pulse 77  Temp(Src) 98.6 F (37 C) (Oral)  Resp 20  Ht 5' 9.5" (1.765 m)  Wt 205 lb (92.987 kg)  BMI 29.85 kg/m2  SpO2 98% Physical Exam  Constitutional: He is oriented to person, place, and time. He appears well-developed and well-nourished.  HENT:    Head: Normocephalic and atraumatic.  Mouth/Throat: Uvula is midline, oropharynx is clear and moist and mucous membranes are normal. He does not have dentures. No oral lesions. No trismus in the jaw. Abnormal dentition. Dental abscesses and dental caries present. No uvula swelling or lacerations. No oropharyngeal exudate, posterior oropharyngeal edema, posterior oropharyngeal erythema or tonsillar abscesses.    Eyes: Conjunctivae are normal. Right eye exhibits no discharge. Left eye exhibits no discharge. No scleral icterus.  Cardiovascular: Normal rate.   Pulmonary/Chest: Effort normal.  Neurological: He is alert and oriented to person, place, and time. Coordination normal.  Skin: Skin is warm and dry. No rash noted. No erythema. No pallor.  Psychiatric: He has a normal mood and affect. His behavior is normal.  Nursing note and vitals reviewed.   ED Course  Procedures (including critical care time) Labs Review Labs Reviewed - No data to display  Imaging Review No results found. I have personally reviewed and evaluated these images and lab results as part of my medical decision-making.   EKG Interpretation None      MDM   Final diagnoses:  Dental infection    Patient was presents with dental pain 3 days after breaking his molar month ago. No evidence of dental abscess or Ludwig's angina. Will treat with anabiotic or dental infection and recommend dentist follow-up.Return precautions outlined in patient discharge instructions.      Lester Kinsman Bethel, PA-C 08/08/15 0205  Leta Baptist,  MD 08/08/15 1610

## 2015-10-14 ENCOUNTER — Emergency Department (HOSPITAL_COMMUNITY)
Admission: EM | Admit: 2015-10-14 | Discharge: 2015-10-14 | Disposition: A | Discharge status: Home / Self Care | Payer: Self-pay | Attending: Emergency Medicine | Admitting: Emergency Medicine

## 2015-10-14 ENCOUNTER — Emergency Department (HOSPITAL_COMMUNITY): Payer: Self-pay

## 2015-10-14 ENCOUNTER — Encounter (HOSPITAL_COMMUNITY): Payer: Self-pay | Admitting: Emergency Medicine

## 2015-10-14 DIAGNOSIS — Y9389 Activity, other specified: Secondary | ICD-10-CM | POA: Insufficient documentation

## 2015-10-14 DIAGNOSIS — Z8719 Personal history of other diseases of the digestive system: Secondary | ICD-10-CM | POA: Insufficient documentation

## 2015-10-14 DIAGNOSIS — S3992XA Unspecified injury of lower back, initial encounter: Secondary | ICD-10-CM | POA: Insufficient documentation

## 2015-10-14 DIAGNOSIS — F17210 Nicotine dependence, cigarettes, uncomplicated: Secondary | ICD-10-CM | POA: Insufficient documentation

## 2015-10-14 DIAGNOSIS — Y92410 Unspecified street and highway as the place of occurrence of the external cause: Secondary | ICD-10-CM | POA: Insufficient documentation

## 2015-10-14 DIAGNOSIS — Y998 Other external cause status: Secondary | ICD-10-CM | POA: Insufficient documentation

## 2015-10-14 DIAGNOSIS — M62838 Other muscle spasm: Secondary | ICD-10-CM | POA: Insufficient documentation

## 2015-10-14 DIAGNOSIS — Z7951 Long term (current) use of inhaled steroids: Secondary | ICD-10-CM | POA: Insufficient documentation

## 2015-10-14 MED ORDER — IBUPROFEN 200 MG PO TABS
600.0000 mg | ORAL_TABLET | Freq: Once | ORAL | Status: AC
  Administered 2015-10-14: 600 mg via ORAL
  Filled 2015-10-14: qty 3

## 2015-10-14 MED ORDER — NAPROXEN 500 MG PO TABS: 500.0000 mg | ORAL_TABLET | Freq: Two times a day (BID) | ORAL | Status: DC

## 2015-10-14 MED ORDER — DIAZEPAM 5 MG PO TABS: 5.0000 mg | ORAL_TABLET | Freq: Two times a day (BID) | ORAL | Status: DC | PRN
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
## 2015-11-15 ENCOUNTER — Emergency Department (HOSPITAL_COMMUNITY)
Admission: EM | Admit: 2015-11-15 | Discharge: 2015-11-15 | Disposition: A | Discharge status: Home / Self Care | Payer: Worker's Compensation | Attending: Emergency Medicine | Admitting: Emergency Medicine

## 2015-11-15 ENCOUNTER — Emergency Department (HOSPITAL_COMMUNITY): Payer: Self-pay

## 2015-11-15 ENCOUNTER — Encounter (HOSPITAL_COMMUNITY): Payer: Self-pay

## 2015-11-15 DIAGNOSIS — S93431A Sprain of tibiofibular ligament of right ankle, initial encounter: Secondary | ICD-10-CM | POA: Insufficient documentation

## 2015-11-15 DIAGNOSIS — Z87891 Personal history of nicotine dependence: Secondary | ICD-10-CM | POA: Insufficient documentation

## 2015-11-15 DIAGNOSIS — Z791 Long term (current) use of non-steroidal anti-inflammatories (NSAID): Secondary | ICD-10-CM | POA: Insufficient documentation

## 2015-11-15 DIAGNOSIS — X501XXA Overexertion from prolonged static or awkward postures, initial encounter: Secondary | ICD-10-CM | POA: Insufficient documentation

## 2015-11-15 DIAGNOSIS — S93401A Sprain of unspecified ligament of right ankle, initial encounter: Secondary | ICD-10-CM

## 2015-11-15 DIAGNOSIS — Y9389 Activity, other specified: Secondary | ICD-10-CM | POA: Insufficient documentation

## 2015-11-15 DIAGNOSIS — Z7951 Long term (current) use of inhaled steroids: Secondary | ICD-10-CM | POA: Insufficient documentation

## 2015-11-15 DIAGNOSIS — Z8719 Personal history of other diseases of the digestive system: Secondary | ICD-10-CM | POA: Insufficient documentation

## 2015-11-15 DIAGNOSIS — Y990 Civilian activity done for income or pay: Secondary | ICD-10-CM | POA: Insufficient documentation

## 2015-11-15 DIAGNOSIS — Y92480 Sidewalk as the place of occurrence of the external cause: Secondary | ICD-10-CM | POA: Insufficient documentation

## 2015-11-15 MED ORDER — IBUPROFEN 200 MG PO TABS
600.0000 mg | ORAL_TABLET | Freq: Once | ORAL | Status: AC
Start: 2015-11-15 — End: 2015-11-15
  Administered 2015-11-15: 600 mg via ORAL
  Filled 2015-11-15: qty 3

## 2015-11-15 NOTE — Discharge Instructions (Signed)
Rest, Ice intermittently (in the first 24-48 hours), Gentle compression with an Ace wrap, and elevate (Limb above the level of the heart) °  °Take up to 800mg of ibuprofen (that is usually 4 over the counter pills)  3 times a day for 5 days. Take with food. ° °Do not hesitate to return to the emergency room for any new, worsening or concerning symptoms. ° °Please obtain primary care using resource guide below. Let them know that you were seen in the emergency room and that they will need to obtain records for further outpatient management. ° ° ° °Ankle Sprain °An ankle sprain is an injury to the strong, fibrous tissues (ligaments) that hold the bones of your ankle joint together.  °CAUSES °An ankle sprain is usually caused by a fall or by twisting your ankle. Ankle sprains most commonly occur when you step on the outer edge of your foot, and your ankle turns inward. People who participate in sports are more prone to these types of injuries.  °SYMPTOMS  °· Pain in your ankle. The pain may be present at rest or only when you are trying to stand or walk. °· Swelling. °· Bruising. Bruising may develop immediately or within 1 to 2 days after your injury. °· Difficulty standing or walking, particularly when turning corners or changing directions. °DIAGNOSIS  °Your caregiver will ask you details about your injury and perform a physical exam of your ankle to determine if you have an ankle sprain. During the physical exam, your caregiver will press on and apply pressure to specific areas of your foot and ankle. Your caregiver will try to move your ankle in certain ways. An X-ray exam may be done to be sure a bone was not broken or a ligament did not separate from one of the bones in your ankle (avulsion fracture).  °TREATMENT  °Certain types of braces can help stabilize your ankle. Your caregiver can make a recommendation for this. Your caregiver may recommend the use of medicine for pain. If your sprain is severe, your  caregiver may refer you to a surgeon who helps to restore function to parts of your skeletal system (orthopedist) or a physical therapist. °HOME CARE INSTRUCTIONS  °· Apply ice to your injury for 1-2 days or as directed by your caregiver. Applying ice helps to reduce inflammation and pain. °¨ Put ice in a plastic bag. °¨ Place a towel between your skin and the bag. °¨ Leave the ice on for 15-20 minutes at a time, every 2 hours while you are awake. °· Only take over-the-counter or prescription medicines for pain, discomfort, or fever as directed by your caregiver. °· Elevate your injured ankle above the level of your heart as much as possible for 2-3 days. °· If your caregiver recommends crutches, use them as instructed. Gradually put weight on the affected ankle. Continue to use crutches or a cane until you can walk without feeling pain in your ankle. °· If you have a plaster splint, wear the splint as directed by your caregiver. Do not rest it on anything harder than a pillow for the first 24 hours. Do not put weight on it. Do not get it wet. You may take it off to take a shower or bath. °· You may have been given an elastic bandage to wear around your ankle to provide support. If the elastic bandage is too tight (you have numbness or tingling in your foot or your foot becomes cold and blue), adjust the   bandage to make it comfortable. °· If you have an air splint, you may blow more air into it or let air out to make it more comfortable. You may take your splint off at night and before taking a shower or bath. Wiggle your toes in the splint several times per day to decrease swelling. °SEEK MEDICAL CARE IF:  °· You have rapidly increasing bruising or swelling. °· Your toes feel extremely cold or you lose feeling in your foot. °· Your pain is not relieved with medicine. °SEEK IMMEDIATE MEDICAL CARE IF: °· Your toes are numb or blue. °· You have severe pain that is increasing. °MAKE SURE YOU:  °· Understand these  instructions. °· Will watch your condition. °· Will get help right away if you are not doing well or get worse. °  °This information is not intended to replace advice given to you by your health care provider. Make sure you discuss any questions you have with your health care provider. °  °Document Released: 10/14/2005 Document Revised: 11/04/2014 Document Reviewed: 10/26/2011 °Elsevier Interactive Patient Education ©2016 Elsevier Inc. ° °Emergency Department Resource Guide °1) Find a Doctor and Pay Out of Pocket °Although you won't have to find out who is covered by your insurance plan, it is a good idea to ask around and get recommendations. You will then need to call the office and see if the doctor you have chosen will accept you as a new patient and what types of options they offer for patients who are self-pay. Some doctors offer discounts or will set up payment plans for their patients who do not have insurance, but you will need to ask so you aren't surprised when you get to your appointment. ° °2) Contact Your Local Health Department °Not all health departments have doctors that can see patients for sick visits, but many do, so it is worth a call to see if yours does. If you don't know where your local health department is, you can check in your phone book. The CDC also has a tool to help you locate your state's health department, and many state websites also have listings of all of their local health departments. ° °3) Find a Walk-in Clinic °If your illness is not likely to be very severe or complicated, you may want to try a walk in clinic. These are popping up all over the country in pharmacies, drugstores, and shopping centers. They're usually staffed by nurse practitioners or physician assistants that have been trained to treat common illnesses and complaints. They're usually fairly quick and inexpensive. However, if you have serious medical issues or chronic medical problems, these are probably not  your best option. ° °No Primary Care Doctor: °- Call [facility] at  832-8000 - they can help you locate a primary care doctor that  accepts your insurance, provides certain services, etc. °- Physician Referral Service- 1-800-533-3463 ° °Chronic Pain Problems: °Organization         Address  Phone   Notes  °[facility]  (336) 297-2271 Patients need to be referred by their primary care doctor.  ° °Medication Assistance: °Organization         Address  Phone   Notes  °[place] [facility] [address] °[place], [place] [address] (336) 641-8030 --Must be a resident of [place] °-- Must have NO insurance coverage whatsoever (no Medicaid/ Medicare, etc.) °-- The pt. MUST have a primary care doctor that directs their   care regularly and follows them in the community °  °[facility]  (866) 331-1348   °[employer]  (888) 892-1162   ° °Agencies that provide inexpensive medical care: °Organization         Address  Phone   Notes  °[facility]  (336) 832-8035   °[facility]    (336) 832-7272   °[facility] [address] °[place], [place] [address] (336) 832-4777   °[facility] [address], °[place] (336) 271-4999   °[facility]    (336) 373-0678   °[facility]    (336) 272-1050   °[facility] ° [address], [place] Phone:  (336) 832-4444, Fax:  (336) 832-4440 Hours of Operation:  9 am - 6 pm, M-F.  Also accepts Medicaid/Medicare and self-pay.  °[facility] ° [address], [place] Phone: (336) 832-3150, Fax: (336) 832-3151. Hours of Operation:  8:30 am - 5:30 pm, M-F.  Also accepts Medicaid and self-pay.  °[facility] [place] [address], [place] Phone: (336) 878-6027   °[facility] [address], [place], [place] (336)723-1848, Ext. 123 Mondays & Thursdays: 7-9 AM.  First  15 patients are seen on a first come, first serve basis. °  ° °Medicaid-accepting [place] Providers: ° °Organization         Address  Phone   Notes  °[facility] [address], [place] (336) 641-2100 Also accepts self-pay patients.  °[facility] [address], [place] ° (336) 856-9996   °[facility] [address], [place] (336) 288-8857   °[facility] [address] [place] [address], [place] (336) 299-7000   °Veita Bland [address], [place]  ° (336) 373-1557 Only accepts [place] Access Medicaid patients after they have their name applied to their card.  ° °Self-Pay (no insurance) in [place]: ° °Organization         Address  Phone   Notes  °Sickle Cell Patients, [facility] [address], [place] (336) 832-1970   °[facility] [address], [place] (336) 832-4400   °[facility] ° [address], [place] (336) 992-4800   °[facility]/Dr. Osei-Bonsu ° [address] [place] [address], [place] or [address], [place] (336) [phone] Phone number for both [place] and [place] locations is the same.  °[facility] [address], [place] (336) 299-0000   °[facility] [address] [place] [address], [place] or [address] (336) 852-7530 °(336) 878-2260   °[facility] [address], [place] (336) 350-1642, phone; (336) 294-5005, fax Sees patients 1st and 3rd Saturday of every month.  Must not qualify for public or private insurance (i.e. Medicaid, Medicare, [place] Health Choice, Veterans' Benefits) • Household income should be no more than 200% of the poverty level •The clinic cannot treat you if you are pregnant or think you are pregnant • Sexually transmitted diseases are not treated at the clinic.  ° ° °Dental  Care: °Organization         Address  Phone  Notes  °[facility] [address],  (336) 641-6152 Accepts children up to age   21 who are enrolled in Medicaid or [place] Health Choice; pregnant women with a Medicaid card; and children who have applied for Medicaid or [place] Health Choice, but were declined, whose parents can pay a reduced fee at time of service.  °[facility] [place]  [address], [place] (336) 641-7733 Accepts children up to age 21 who are enrolled in Medicaid or [place] Health Choice; pregnant women with a Medicaid card; and children who have applied for Medicaid or [place] Health Choice, but were declined, whose parents can pay a reduced fee at time of service.  °[facility] ° [address], [place] (336) 641-4533 Patients are seen by appointment only. Walk-ins are not accepted. [facility] will see patients 18 years of age and older. °Monday - Tuesday (8am-5pm) °Most Wednesdays (8:30-5pm) °$30 per visit, cash only  °[facility] ° [address], [place] (336) 641-4533 Patients are seen by appointment only. Walk-ins are not accepted. [facility] will see patients 18 years of age and older. °One Wednesday Evening (Monthly: Volunteer Based).  $30 per visit, cash only  °[facility]  (919) 537-3737 for adults; Children under age 4, call Graduate Pediatric Dentistry at ([phone]) [phone]. Children aged 4-14, please call (919) [phone] to request a pediatric application. ° Dental services are provided in all areas of dental care including fillings, crowns and bridges, complete and partial dentures, implants, gum treatment, root canals, and extractions. Preventive care is also provided. Treatment is provided to both adults and children. °Patients are selected via a lottery and there is often a waiting list. °  °[facility] [address], °[place] ° (336) 763-8833 [email or web address] °  °[facility] [address], [place], [place] (336)723-1848, Ext. 123 Second and Fourth Thursday of each month, opens at 6:30 AM; Clinic ends at 9 AM.  Patients are seen on a first-come first-served basis, and a limited number are seen during each clinic.  ° °[facility] ° [address], [place], [place] (336) 723-7904   Eligibility Requirements °You must have lived in [place], [place], or [place] counties for at least the last three months. °  You cannot be eligible for state or federal sponsored healthcare insurance, including Veterans Administration, Medicaid, or Medicare. °  You generally cannot be eligible for healthcare insurance through your employer.  °  How to apply: °Eligibility screenings are held every Tuesday and Wednesday afternoon from 1:00 pm until 4:00 pm. You do not need an appointment for the interview!  °[facility] [address], [place], [place] 336-631-2330   °[facility]  336-342-8273   °[facility]  336-703-3100   °[facility]  336-570-6415   ° °Behavioral Health Resources in the Community: °Intensive Outpatient Programs °Organization         Address  Phone  Notes  °[facility] [address], [place], [place] 336-878-6098   °[facility] [address], [place], [place] 336-832-9800   °[facility] [address], [place],  ° 336-882-2125   °[facility] [address],  °[place],  1-800-853-5163 or 336-641-4981   °Substance Abuse Resources °Organization         Address  Phone  Notes  °[facility]  336-882-2125   °[facility]    336-784-9470   °[facility]  336-285-9073   °[facility]  336-845-3988   °Residential & Outpatient Substance Abuse Program  1-800-659-3381    °Psychological Services °Organization         Address  Phone  Notes  °[facility]  336- 832-9600   °[facility]  336- 378-7881   °[facility] [address], [place] 1-800-853-5163 or 336-641-4981   ° °Mobile Crisis Teams °Organization         Address  Phone  Notes  °[facility], Mobile Crisis Care Unit  1-877-626-1772   °Assertive °[facility] ° [address] [place], [place] 336-834-9664   °Sharon DeEsch [address] °[place] 336-554-5454   ° °Self-Help/Support Groups °Organization         Address  Phone             Notes  °[facility] - variety of support groups  336- 373-1402 Call for more information  °Narcotics Anonymous (NA), [facility] [address], °[place]  2 meetings at this location  ° °Residential Treatment Programs °Organization         Address  Phone  Notes  °[facility] [address],    °[place]  1-866-801-8205   °[facility] ° [address], [place], [place] 704-293-8524   °[facility] [address], [place] 336-845-3988 Admissions: 8am-3pm M-F  °[facility] [address]    °[place], [place] 336-841-1104   °[facility] [address], [place], [place] 336-379-7146   °[facility] [address]  °[place], [place] 336-285-9073   °[facility] - Intensive Outpatient [address], [place], [place] 336-852-3033   °[facility] [address]  °[place], [place] 1-877-615-2722 or 336-784-9470   °[facility] [address] [place], [place] 336-227-7417 Accepts Medicaid  °[facility] [address]  °[place] 1-800-659-3381 Substance Abuse/Addiction Treatment  ° °[place] Behavioral Health Resources °Organization         Address  Phone  Notes  °[facility]  (888) 581-9988   °Julie Brannon, PhD [address] [place], [place]   (336) 349-5553 or (336) 951-0000   °[facility]   [address] °[place], [place] (336) 349-4454   °[facility] [address], [place], [place] (336) 342-8316 Insurance/Medicaid/sponsorship through [facility]  °[facility] [address]                                    [place], [place] (336) 342-8316 Therapy/tele-psych/case  °[facility] [address]  ° [place],  (336) 349-2233    °Dr. Arfeen  (336) 349-4544   °[facility] [place]  [employer] [place] [facility] 1) [address], [place] °2) [address], [place] °3)  [address], [place] (336) 349-3220 °(336) 342-7768 ° °(336) 342-8140   °[place] [facility] (336) 342-1394 or (336) 342-3537 (After Hours)    ° ° ° °

## 2015-11-15 NOTE — ED Notes (Signed)
Pt was at work. Pt rolled ankle on metal plate.  C/O rt lateral ankle pain. Swelling noted

## 2015-11-15 NOTE — ED Provider Notes (Signed)
CSN: [id number]     Arrival date & time 11/15/15  1012 History   First MD Initiated Contact with Patient 11/15/15 1021     Chief Complaint  Patient presents with  . Ankle Injury     (Consider location/radiation/quality/duration/timing/severity/associated sxs/prior Treatment) HPI   There were no vitals taken for this visit.  Kevin Lloyd is a 40 y.o. male complaining of pain to right ankle onset this morning when a plate in the sidewalk moved and he rolled the ankle. Rates his pain is severe, is not able to weight-bear. No prior surgeries, as to the affected joint. No pain medication taken prior to arrival.   Past Medical History  Diagnosis Date  . Acid reflux    Past Surgical History  Procedure Laterality Date  . Wisdom tooth extraction     History reviewed. No pertinent family history. Social History  Substance Use Topics  . Smoking status: Former Smoker -- 1.00 packs/day    Types: Cigarettes  . Smokeless tobacco: Never Used  . Alcohol Use: No    Review of Systems  10 systems reviewed and found to be negative, except as noted in the HPI.   Allergies  Codeine and Azithromycin  Home Medications   Prior to Admission medications   Medication Sig Start Date End Date Taking? Authorizing Provider  amoxicillin-clavulanate (AUGMENTIN) 875-125 MG tablet Take 1 tablet by mouth every 12 (twelve) hours. Patient not taking: Reported on 10/14/2015 08/07/15   Samantha Tripp Dowless, PA-C  diazepam (VALIUM) 5 MG tablet Take 1 tablet (5 mg total) by mouth every 12 (twelve) hours as needed for muscle spasms. 10/14/15   Derwood Kaplan, MD  naproxen (NAPROSYN) 500 MG tablet Take 1 tablet (500 mg total) by mouth 2 (two) times daily with a meal. 10/14/15   Derwood Kaplan, MD  oxyCODONE-acetaminophen (PERCOCET/ROXICET) 5-325 MG per tablet Take 1 tablet by mouth every 6 (six) hours as needed for severe pain. Patient not taking: Reported on 10/14/2015 11/08/14   Lenda Kelp, MD   oxyCODONE-acetaminophen (PERCOCET/ROXICET) 5-325 MG tablet Take 2 tablets by mouth every 4 (four) hours as needed for severe pain. Patient not taking: Reported on 10/14/2015 08/07/15   Samantha Tripp Dowless, PA-C  triamcinolone (NASACORT ALLERGY 24HR) 55 MCG/ACT AERO nasal inhaler Place 2 sprays into the nose daily as needed. Congestion    Historical Provider, MD   BP 137/93 mmHg  Pulse 79  Temp(Src) 98.8 F (37.1 C) (Oral)  Resp 16  SpO2 100% Physical Exam  Constitutional: He is oriented to person, place, and time. He appears well-developed and well-nourished. No distress.  HENT:  Head: Normocephalic.  Eyes: Conjunctivae and EOM are normal.  Cardiovascular: Normal rate.   Pulmonary/Chest: Effort normal. No stridor.  Musculoskeletal: Normal range of motion. He exhibits tenderness. He exhibits no edema.  Right ankle:  No deformity, no overlying skin changes, mild swelling and tenderness to palpation to bilateral malleolus. No bony tenderness palpation, distally neurovascularly intact. Ambulates with antalgic gait.    Neurological: He is alert and oriented to person, place, and time.  Psychiatric: He has a normal mood and affect.  Nursing note and vitals reviewed.   ED Course  Procedures (including critical care time) Labs Review Labs Reviewed - No data to display  Imaging Review Dg Ankle Complete Right  11/15/2015  CLINICAL DATA:  Twisting right ankle injury stepping off a curb this morning. Lateral ankle pain and soft tissue swelling. EXAM: RIGHT ANKLE - COMPLETE 3+ VIEW COMPARISON:  10/09/2001 report  FINDINGS: 9 mm lucency compatible with osteochondral lesion along the medial talar dome. There is spurring along the distal tibial rim and along the inferior margin of the medial malleolus. No malleolar fracture is identified. Dorsal midfoot spurring. Faint linear lucency tracking along the top of the medial cuneiform is thought to likely be incidental/artifactual. Plantar and  Achilles calcaneal spurs. IMPRESSION: 1. 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 2. Tibiotalar spurring and dorsal midfoot spurring. 3. Plantar and Achilles calcaneal spurs. Electronically Signed   By: Gaylyn Rong M.D.   On: 11/15/2015 10:47   I have personally reviewed and evaluated these images and lab results as part of my medical decision-making.   EKG Interpretation None      MDM   Final diagnoses:  Ankle sprain, right, initial encounter    Filed Vitals:   11/15/15 1045  BP: 137/93  Pulse: 79  Temp: 98.8 F (37.1 C)  TempSrc: Oral  Resp: 16  SpO2: 100%    Medications  ibuprofen (ADVIL,MOTRIN) tablet 600 mg (600 mg Oral Given 11/15/15 1105)    Kevin Lloyd is 40 y.o. male presenting with ankle pain after patient rolled ankle while working. ankle pain. Patient is ambulatory. No point tenderness or bony tenderness to palpation. Neurovascularly intact. X-rays negative. Patient will be given crutches, Ace wrap, and advised to maintain RICE.   Patient works as a Games developer, he needs clearance to return to work. I've advised him he will need to have a recheck to be cleared. Patient given referral to [facility], Dr. Pearletha [facility] sports medicine, also advised if he has any issues he can return to the emergency room for clearance.  Evaluation does not show pathology that would require ongoing emergent intervention or inpatient treatment. Pt is hemodynamically stable and mentating appropriately. Discussed findings and plan with patient/guardian, who agrees with care plan. All questions answered. Return precautions discussed and outpatient follow up given.      Wynetta Emery, PA-C 11/15/15 1108  Gerhard Munch, MD 11/16/15 [phone]

## 2015-11-21 ENCOUNTER — Encounter: Payer: Self-pay | Admitting: Family Medicine

## 2015-11-21 ENCOUNTER — Ambulatory Visit (INDEPENDENT_AMBULATORY_CARE_PROVIDER_SITE_OTHER): Payer: Worker's Compensation | Admitting: Family Medicine

## 2015-11-21 VITALS — BP 162/100 | HR 106 | Ht 70.0 in | Wt 210.0 lb

## 2015-11-21 DIAGNOSIS — S99911A Unspecified injury of right ankle, initial encounter: Secondary | ICD-10-CM

## 2015-11-21 NOTE — Patient Instructions (Signed)
You have a Grade 2 ankle sprain. Ice the area for 15 minutes at a time, 3-4 times a day Aleve 2 tabs twice a day with food OR ibuprofen 3 tabs three times a day with food for pain and inflammation. Elevate above the level of your heart when possible Use laceup ankle brace to help with stability while you recover from this injury. Come out of the brace twice a day to do Up/down and alphabet exercises 2-3 sets of each. Start theraband strengthening exercises when directed - once a day 3 sets of 10. - start in a few days. Consider physical therapy for strengthening and balance exercises in the future. If not improving as expected, we may repeat x-rays or consider further testing like an MRI. Follow up with me in 2 weeks.

## 2015-11-22 DIAGNOSIS — S99911A Unspecified injury of right ankle, initial encounter: Secondary | ICD-10-CM | POA: Insufficient documentation

## 2015-11-22 NOTE — Assessment & Plan Note (Signed)
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  Shown home exercises to do daily.  Icing, elevation.  ASO for support.  Light duty desk job until f/u in 2 weeks.

## 2015-11-22 NOTE — Progress Notes (Signed)
PCP: Default, Provider, MD  Subjective:   HPI: Patient is a 40 y.o. male here for right ankle injury.  Patient reports on 1/18 at work he stepped on a metal plate on the ground that was not bolted as it should have been causing him to invert his right ankle. Able to bear weight though painful up to 6/10 and sharp with walking. Has sprained this remotely but always improved completely. Has been elevating, taking aleve or ibuprofen at times. No skin changes, fever, other complaints.  Past Medical History  Diagnosis Date  . Acid reflux     Current Outpatient Prescriptions on File Prior to Visit  Medication Sig Dispense Refill  . oxyCODONE-acetaminophen (PERCOCET/ROXICET) 5-325 MG per tablet Take 1 tablet by mouth every 6 (six) hours as needed for severe pain. (Patient not taking: Reported on 10/14/2015) 60 tablet 0  . triamcinolone (NASACORT ALLERGY 24HR) 55 MCG/ACT AERO nasal inhaler Place 2 sprays into the nose daily as needed. Congestion     No current facility-administered medications on file prior to visit.    Past Surgical History  Procedure Laterality Date  . Wisdom tooth extraction      Allergies  Allergen Reactions  . Codeine Anaphylaxis    Childhood allergy  . Azithromycin Other (See Comments)    Caused his tongue to breakout with bones    Social History   Social History  . Marital Status: Single    Spouse Name: N/A  . Number of Children: N/A  . Years of Education: N/A   Occupational History  . Not on file.   Social History Main Topics  . Smoking status: Former Smoker -- 1.00 packs/day    Types: Cigarettes  . Smokeless tobacco: Never Used  . Alcohol Use: No  . Drug Use: No  . Sexual Activity: Not on file   Other Topics Concern  . Not on file   Social History Narrative    No family history on file.  BP 162/100 mmHg  Pulse 106  Ht  (1.778 m)  Wt 210 lb (95.255 kg)  BMI 30.13 kg/m2  Review of Systems: See HPI above.    Objective:   Physical Exam:  Gen: NAD  Right ankle: No gross deformity, swelling, ecchymoses Mild limitation with ext rotation, otherwise full.  5/5 strength all directions. TTP over ATFL.  No malleolar, base 5th, navicular or other tenderness. 1+ ant drawer, trace talar tilt. Negative syndesmotic compression. Thompsons test negative. NV intact distally.  Left ankle: FROM without pain.    Assessment & Plan:  1. 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  Shown home exercises to do daily.  Icing, elevation.  ASO for support.  Light duty desk job until f/u in 2 weeks.

## 2015-12-05 ENCOUNTER — Encounter: Payer: Self-pay | Admitting: Family Medicine

## 2015-12-05 ENCOUNTER — Ambulatory Visit (INDEPENDENT_AMBULATORY_CARE_PROVIDER_SITE_OTHER): Payer: Worker's Compensation | Admitting: Family Medicine

## 2015-12-05 VITALS — BP 123/75 | HR 81 | Ht 70.0 in | Wt 210.0 lb

## 2015-12-05 DIAGNOSIS — S99911D Unspecified injury of right ankle, subsequent encounter: Secondary | ICD-10-CM

## 2015-12-05 NOTE — Patient Instructions (Signed)
Wear the laceup brace at work and with long walks, irregular surfaces for just 4 more weeks. Do home exercises 3-4 times a week for 4 weeks then can stop these. Follow up with me in 4 weeks or as needed.

## 2015-12-07 NOTE — Assessment & Plan Note (Signed)
2/2 sprain.  Much improved at this point.  Continue ASO and home exercises for 4 more weeks.  F/u at that time or as needed.

## 2015-12-07 NOTE — Progress Notes (Signed)
PCP: Default, Provider, MD  Subjective:   HPI: Patient is a 40 y.o. male here for right ankle injury.  1/24: Patient reports on 1/18 at work he stepped on a metal plate on the ground that was not bolted as it should have been causing him to invert his right ankle. Able to bear weight though painful up to 6/10 and sharp with walking. Has sprained this remotely but always improved completely. Has been elevating, taking aleve or ibuprofen at times. No skin changes, fever, other complaints.  2/7: Patient reports he feels much better. Pain level 0/10. No swelling. Able to ambulate without pain. Doing home exercises. No skin changes, fever, other complaints.  Past Medical History  Diagnosis Date  . Acid reflux     Current Outpatient Prescriptions on File Prior to Visit  Medication Sig Dispense Refill  . oxyCODONE-acetaminophen (PERCOCET/ROXICET) 5-325 MG per tablet Take 1 tablet by mouth every 6 (six) hours as needed for severe pain. (Patient not taking: Reported on 10/14/2015) 60 tablet 0  . triamcinolone (NASACORT ALLERGY 24HR) 55 MCG/ACT AERO nasal inhaler Place 2 sprays into the nose daily as needed. Congestion     No current facility-administered medications on file prior to visit.    Past Surgical History  Procedure Laterality Date  . Wisdom tooth extraction      Allergies  Allergen Reactions  . Codeine Anaphylaxis    Childhood allergy  . Azithromycin Other (See Comments)    Caused his tongue to breakout with bones    Social History   Social History  . Marital Status: Single    Spouse Name: N/A  . Number of Children: N/A  . Years of Education: N/A   Occupational History  . Not on file.   Social History Main Topics  . Smoking status: Former Smoker -- 1.00 packs/day    Types: Cigarettes  . Smokeless tobacco: Never Used  . Alcohol Use: No  . Drug Use: No  . Sexual Activity: Not on file   Other Topics Concern  . Not on file   Social History Narrative     No family history on file.  BP 123/75 mmHg  Pulse 81  Ht  (1.778 m)  Wt 210 lb (95.255 kg)  BMI 30.13 kg/m2  Review of Systems: See HPI above.    Objective:  Physical Exam:  Gen: NAD  Right ankle: No gross deformity, swelling, ecchymoses FROM.   5/5 strength all directions. Minimal TTP over ATFL.  No malleolar, base 5th, navicular or other tenderness. Trace ant drawer, negative talar tilt. Negative syndesmotic compression. Thompsons test negative. NV intact distally.  Left ankle: FROM without pain.    Assessment & Plan:  1. Right ankle injury - 2/2 sprain.  Much improved at this point.  Continue ASO and home exercises for 4 more weeks.  F/u at that time or as needed.

## 2017-04-22 ENCOUNTER — Emergency Department (HOSPITAL_COMMUNITY)
Admission: EM | Admit: 2017-04-22 | Discharge: 2017-04-22 | Disposition: A | Discharge status: Home / Self Care | Payer: Self-pay | Attending: Emergency Medicine | Admitting: Emergency Medicine

## 2017-04-22 ENCOUNTER — Encounter (HOSPITAL_COMMUNITY): Payer: Self-pay | Admitting: Emergency Medicine

## 2017-04-22 DIAGNOSIS — K0889 Other specified disorders of teeth and supporting structures: Secondary | ICD-10-CM | POA: Insufficient documentation

## 2017-04-22 DIAGNOSIS — Z87891 Personal history of nicotine dependence: Secondary | ICD-10-CM | POA: Insufficient documentation

## 2017-04-22 MED ORDER — PENICILLIN V POTASSIUM 500 MG PO TABS: 500.0000 mg | ORAL_TABLET | Freq: Four times a day (QID) | ORAL | 0 refills | Status: AC

## 2017-04-22 MED ORDER — TRAMADOL HCL 50 MG PO TABS
50.0000 mg | ORAL_TABLET | Freq: Four times a day (QID) | ORAL | 0 refills | Status: DC | PRN
Start: 2017-04-22 — End: 2021-08-14

## 2017-04-22 NOTE — Discharge Instructions (Signed)
You have a dental injury/infection. It is very important that you get evaluated by a dentist as soon as possible. Call tomorrow to schedule an appointment. Ibuprofen as needed for pain. Take your full course of antibiotics. Read the instructions below. ° °Eat a soft or liquid diet and rinse your mouth out after meals with warm water. You should see a dentist or return here at once if you have increased swelling, increased pain or uncontrolled bleeding from the site of your injury. ° °SEEK MEDICAL CARE IF:  °You have increased pain not controlled with medicines.  °You have swelling around your tooth, in your face or neck.  °You have bleeding which starts, continues, or gets worse.  °You have a fever >101 °If you are unable to open your mouth °

## 2017-04-22 NOTE — ED Provider Notes (Signed)
[facility]-EMERGENCY DEPT Provider Note   CSN: [id number] Arrival date & time: 04/22/17  1217   By signing my name below, I, Kevin Lloyd, attest that this documentation has been prepared under the direction and in the presence of [facility], PA-C. Electronically Signed: Freida Lloyd, Scribe. 04/22/2017. 2:29 PM.   History   Chief Complaint Chief Complaint  Patient presents with  . Dental Pain    The history is provided by the patient. No language interpreter was used.     HPI Comments:  Kevin Lloyd is a 41 y.o. male who presents to the Emergency Department complaining of moderate, throbbing right sided dental pain x 4-5 days. He has applied orajel and taken aleve with moderate relief in the beginning but less relief over the last 2 days. No fever. Pt has no other acute complaints or associated symptoms at this time. No trouble swallowing or breathing. No neck pain. He states he does not have a current dentist due to lack of insurance.    Past Medical History:  Diagnosis Date  . Acid reflux     Patient Active Problem List   Diagnosis Date Noted  . Right ankle injury 11/22/2015  . Right shoulder injury 11/10/2014    Past Surgical History:  Procedure Laterality Date  . WISDOM TOOTH EXTRACTION         Home Medications    Prior to Admission medications   Medication Sig Start Date End Date Taking? Authorizing Provider  oxyCODONE-acetaminophen (PERCOCET/ROXICET) 5-325 MG per tablet Take 1 tablet by mouth every 6 (six) hours as needed for severe pain. Patient not taking: Reported on 10/14/2015 11/08/14   Lenda Kelp, MD  penicillin v potassium (VEETID) 500 MG tablet Take 1 tablet (500 mg total) by mouth 4 (four) times daily. 04/22/17 04/29/17  Marvelyn Bouchillon, Chase Picket, PA-C  traMADol (ULTRAM) 50 MG tablet Take 1 tablet (50 mg total) by mouth every 6 (six) hours as needed. 04/22/17   Ronald Vinsant, Chase Picket, PA-C  triamcinolone (NASACORT ALLERGY 24HR) 55 MCG/ACT AERO nasal inhaler  Place 2 sprays into the nose daily as needed. Congestion    [provider]    Family History History reviewed. No pertinent family history.  Social History Social History  Substance Use Topics  . Smoking status: Former Smoker    Packs/day: 1.00    Types: Cigarettes  . Smokeless tobacco: Never Used  . Alcohol use No     Allergies   Codeine and Azithromycin   Review of Systems Review of Systems  Constitutional: Negative for fever.  HENT: Positive for dental problem. Negative for congestion, sore throat and trouble swallowing.   Respiratory: Negative for shortness of breath.      Physical Exam Updated Vital Signs BP (!) 131/97 (BP Location: Left Arm)   Pulse 66   Temp 98.2 F (36.8 C) (Oral)   Resp 16   SpO2 98%   Physical Exam  Constitutional: He appears well-developed and well-nourished. No distress.  HENT:  Head: Normocephalic and atraumatic.  Mouth/Throat:    Dental cavities and poor oral dentition noted. Pain along tooth as depicted in image. No abscess noted. Midline uvula. No trismus. OP moist and clear. No oropharyngeal erythema or edema. Neck supple with no tenderness. No facial edema.  Neck: Neck supple.  Cardiovascular: Normal rate, regular rhythm and normal heart sounds.   No murmur heard. Pulmonary/Chest: Effort normal and breath sounds normal. No respiratory distress. He has no wheezes. He has no rales.  Musculoskeletal: Normal range  of motion.  Neurological: He is alert.  Skin: Skin is warm and dry.  Nursing note and vitals reviewed.    ED Treatments / Results  DIAGNOSTIC STUDIES:  Oxygen Saturation is 96% on RA, normal by my interpretation.    COORDINATION OF CARE:  2:29 PM Discussed treatment plan with pt at bedside and pt agreed to plan.  Labs (all labs ordered are listed, but only abnormal results are displayed) Labs Reviewed - No data to display  EKG  EKG Interpretation None       Radiology No results  found.  Procedures Procedures (including critical care time)  Medications Ordered in ED Medications - No data to display   Initial Impression / Assessment and Plan / ED Course  I have reviewed the triage vital signs and the nursing notes.  Pertinent labs & imaging results that were available during my care of the patient were reviewed by me and considered in my medical decision making (see chart for details).      [name] is a 41 y.o. male who presents to ED for dental pain. No abscess requiring immediate incision and drainage. Patient is afebrile, non toxic appearing, and swallowing secretions well. Exam not concerning for Ludwig's angina or pharyngeal abscess. Will treat with PenVK. I provided referral to on-call dentist and stressed the importance of dental follow up for ultimate management of dental pain. Patient voices understanding and is agreeable to plan.  Final Clinical Impressions(s) / ED Diagnoses   Final diagnoses:  Pain, dental    New Prescriptions Discharge Medication List as of 04/22/2017  2:41 PM    START taking these medications   Details  penicillin v potassium (VEETID) 500 MG tablet Take 1 tablet (500 mg total) by mouth 4 (four) times daily., Starting Tue 04/22/2017, Until Tue 04/29/2017, Print    traMADol (ULTRAM) 50 MG tablet Take 1 tablet (50 mg total) by mouth every 6 (six) hours as needed., Starting Tue 04/22/2017, Print      I personally performed the services described in this documentation, which was scribed in my presence. The recorded information has been reviewed and is accurate.     Kyriakos Babler, Chase PicketJaime Pilcher, PA-C 04/22/17 1551    Samuel JesterMcManus, Kathleen, DO 04/25/17 [id number]

## 2017-04-22 NOTE — ED Triage Notes (Signed)
Pt c/o bilateral lower molar pain onset 4-5 days ago. Self-treating with Aleve and oro-gel, initially reduced pain but not anymore.

## 2018-01-19 ENCOUNTER — Other Ambulatory Visit: Payer: Self-pay

## 2018-01-19 ENCOUNTER — Encounter (HOSPITAL_BASED_OUTPATIENT_CLINIC_OR_DEPARTMENT_OTHER): Payer: Self-pay | Admitting: *Deleted

## 2018-01-19 ENCOUNTER — Emergency Department (HOSPITAL_BASED_OUTPATIENT_CLINIC_OR_DEPARTMENT_OTHER)
Admission: EM | Admit: 2018-01-19 | Discharge: 2018-01-19 | Disposition: A | Discharge status: Home / Self Care | Payer: Self-pay | Attending: Emergency Medicine | Admitting: Emergency Medicine

## 2018-01-19 DIAGNOSIS — Z87891 Personal history of nicotine dependence: Secondary | ICD-10-CM | POA: Insufficient documentation

## 2018-01-19 DIAGNOSIS — Z79899 Other long term (current) drug therapy: Secondary | ICD-10-CM | POA: Insufficient documentation

## 2018-01-19 DIAGNOSIS — K0889 Other specified disorders of teeth and supporting structures: Secondary | ICD-10-CM | POA: Insufficient documentation

## 2018-01-19 MED ORDER — PENICILLIN V POTASSIUM 250 MG PO TABS
500.0000 mg | ORAL_TABLET | Freq: Once | ORAL | Status: AC
  Administered 2018-01-19: 500 mg via ORAL
  Filled 2018-01-19: qty 2

## 2018-01-19 MED ORDER — PENICILLIN V POTASSIUM 500 MG PO TABS: 500.0000 mg | ORAL_TABLET | Freq: Four times a day (QID) | ORAL | 0 refills | Status: AC

## 2018-01-19 MED ORDER — NAPROXEN 250 MG PO TABS
500.0000 mg | ORAL_TABLET | Freq: Once | ORAL | Status: AC
  Administered 2018-01-19: 500 mg via ORAL
  Filled 2018-01-19: qty 2

## 2018-01-19 MED ORDER — NAPROXEN 500 MG PO TABS: 500.0000 mg | ORAL_TABLET | Freq: Two times a day (BID) | ORAL | 0 refills | Status: DC

## 2018-01-19 MED FILL — NAPROXEN 500 MG TABLET: 500 | 15 days supply | Qty: 30 | Fill #0

## 2018-01-19 MED FILL — PENICILLIN VK 500 MG TABLET: 500 | 7 days supply | Qty: 28 | Fill #0

## 2018-01-19 NOTE — ED Provider Notes (Signed)
[facility] EMERGENCY DEPARTMENT Provider Note   CSN: [id number] Arrival date & time: 01/19/18  1146     History   Chief Complaint Chief Complaint  Patient presents with  . Dental Pain    HPI Kevin Lloyd is a 42 y.o. male who presents the emergency department complaining of left lower dental pain for the past 2 days.  Patient states that he has a cracked tooth in the left lower gumline, this is been cracked for several months now, he has required antibiotic treatment for this in the past and this feels similar now.  States that over the past 2 days he has had aching pain to this region.  Rates it a 12 out of 10 in severity.  He has tried Aleve and Tylenol without significant relief.  Denies trouble swallowing, drooling, difficulty opening his mouth, or difficulty moving his neck.  Denies fever or chills.  HPI  Past Medical History:  Diagnosis Date  . Acid reflux     Patient Active Problem List   Diagnosis Date Noted  . Right ankle injury 11/22/2015  . Right shoulder injury 11/10/2014    Past Surgical History:  Procedure Laterality Date  . WISDOM TOOTH EXTRACTION          Home Medications    Prior to Admission medications   Medication Sig Start Date End Date Taking? Authorizing Provider  OMEPRAZOLE PO Take by mouth.   Yes [provider]  oxyCODONE-acetaminophen (PERCOCET/ROXICET) 5-325 MG per tablet Take 1 tablet by mouth every 6 (six) hours as needed for severe pain. Patient not taking: Reported on 10/14/2015 11/08/14   Lenda KelpHudnall, Shane R, MD  traMADol (ULTRAM) 50 MG tablet Take 1 tablet (50 mg total) by mouth every 6 (six) hours as needed. 04/22/17   Ward, Chase PicketJaime Pilcher, PA-C  triamcinolone (NASACORT ALLERGY 24HR) 55 MCG/ACT AERO nasal inhaler Place 2 sprays into the nose daily as needed. Congestion    [provider]    Family History History reviewed. No pertinent family history.  Social History Social History   Tobacco Use  .  Smoking status: Former Smoker    Packs/day: 1.00    Types: Cigarettes  . Smokeless tobacco: Never Used  Substance Use Topics  . Alcohol use: No    Alcohol/week: 0.0 oz  . Drug use: No     Allergies   Codeine and Azithromycin   Review of Systems Review of Systems  Constitutional: Negative for chills and fever.  HENT: Positive for dental problem. Negative for drooling, sore throat, trouble swallowing and voice change.   Respiratory: Negative for shortness of breath.      Physical Exam Updated Vital Signs BP (!) 152/95   Pulse 78   Temp 98.7 F (37.1 C) (Oral)   Resp 20   Ht 5' 9.5" (1.765 m)   Wt 99.8 kg (220 lb)   SpO2 98%   BMI 32.02 kg/m   Physical Exam  Constitutional: He appears well-developed and well-nourished.  Non-toxic appearance. No distress.  HENT:  Head: Normocephalic and atraumatic.  Right Ear: Tympanic membrane is not perforated, not erythematous, not retracted and not bulging.  Left Ear: Tympanic membrane is not perforated, not erythematous, not retracted and not bulging.  Nose: Nose normal.  Mouth/Throat: Uvula is midline and oropharynx is clear and moist. No oropharyngeal exudate or posterior oropharyngeal erythema.    Has somewhat poor dentition throughout.  Patient is tolerating his own secretions without difficulty.  No drooling.  No trismus.  Submandibular compartment is soft.  No hot potato voice.  Eyes: Conjunctivae are normal. Right eye exhibits no discharge. Left eye exhibits no discharge.  Neck: Normal range of motion. Neck supple.  Cardiovascular: Normal rate and regular rhythm.  Pulmonary/Chest: Effort normal and breath sounds normal. No respiratory distress.  Lymphadenopathy:    He has no cervical adenopathy.  Neurological: He is alert.  Clear speech.   Psychiatric: He has a normal mood and affect. His behavior is normal. Thought content normal.  Nursing note and vitals reviewed.   ED Treatments / Results  Labs (all labs ordered  are listed, but only abnormal results are displayed) Labs Reviewed - No data to display  EKG None  Radiology No results found.  Procedures Procedures (including critical care time)  Medications Ordered in ED Medications  penicillin v potassium (VEETID) tablet 500 mg (has no administration in time range)  naproxen (NAPROSYN) tablet 500 mg (has no administration in time range)   Initial Impression / Assessment and Plan / ED Course  I have reviewed the triage vital signs and the nursing notes.  Pertinent labs & imaging results that were available during my care of the patient were reviewed by me and considered in my medical decision making (see chart for details).    Patient presents with dental pain. Patient is nontoxic appearing, BP elevated otherwise vitals WNL- no indication of HTN emergency, patient aware of need for recheck.  No gross abscess.  Exam unconcerning for Ludwig's angina or spread of infection.  Will treat with Penicillin VK and Naproxen.  Urged patient to follow-up with dentist, dental resources were provided.  Discussed treatment plan and need for follow up as well as return precautions. Provided opportunity for questions, patient confirmed understanding and is agreeable to plan.  Final Clinical Impressions(s) / ED Diagnoses   Final diagnoses:  Pain, dental    ED Discharge Orders        Ordered    penicillin v potassium (VEETID) 500 MG tablet  4 times daily     01/19/18 1318    naproxen (NAPROSYN) 500 MG tablet  2 times daily     01/19/18 1318       Taralee Marcus, [place], PA-C 01/19/18 2017    Palumbo, April, MD 01/21/18 0011

## 2018-01-19 NOTE — ED Triage Notes (Signed)
Dental pain x 2 days

## 2018-01-19 NOTE — Discharge Instructions (Addendum)
Call one of the dentists offices provided to schedule an appointment for re-evaluation and further management within the next 48 hours.   I have prescribed you Penicillin VK which is an antibiotic to treat the infection and Naproxen which is an anti-inflammatory medicine to treat the pain.   Please take all of your antibiotics until finished. You may develop abdominal discomfort or diarrhea from the antibiotic.  You may help offset this with probiotics which you can buy at the store (ask your pharmacist if unable to find) or get probiotics in the form of eating yogurt. Do not eat or take the probiotics until 2 hours after your antibiotic. If you are unable to tolerate these side effects follow-up with your primary care provider or return to the emergency department.   If you begin to experience any blistering, rashes, swelling, or difficulty breathing seek medical care for evaluation of potentially more serious side effects.   Be sure to eat something when taking the Naproxen as it can cause stomach upset and at worst stomach bleeding. Do not take additional non steroidal anti-inflammatory medicines such as Ibuprofen, Aleve, or Advil while taking Naproxen. You may supplement with Tylenol.   Please be aware that this medications may interact with other medications you are taking, please be sure to discuss your medication list with your pharmacist. If you are taking birth control the antibiotic will deactivate your birth control for 2 weeks.   If you start to experience and new or worsening symptoms return to the emergency department. If you start to experience fever, chills, neck stiffness/pain, or inability to move your neck come back to the emergency department immediately.     Additional Information:  Your vital signs today were: BP (!) 152/95    Pulse 78    Temp 98.7 F (37.1 C) (Oral)    Resp 20    Ht 5' 9.5" (1.765 m)    Wt 99.8 kg (220 lb)    SpO2 98%    BMI 32.02 kg/m  If your blood  pressure (BP) was elevated above 135/85 this visit, please have this repeated by your doctor within one month. ---------------   [facility] Website for prescriptions

## 2018-07-11 ENCOUNTER — Encounter (HOSPITAL_BASED_OUTPATIENT_CLINIC_OR_DEPARTMENT_OTHER): Payer: Self-pay

## 2018-07-11 ENCOUNTER — Emergency Department (HOSPITAL_BASED_OUTPATIENT_CLINIC_OR_DEPARTMENT_OTHER)
Admission: EM | Admit: 2018-07-11 | Discharge: 2018-07-11 | Disposition: A | Discharge status: Home / Self Care | Payer: Self-pay | Attending: Emergency Medicine | Admitting: Emergency Medicine

## 2018-07-11 ENCOUNTER — Other Ambulatory Visit: Payer: Self-pay

## 2018-07-11 DIAGNOSIS — K0889 Other specified disorders of teeth and supporting structures: Secondary | ICD-10-CM | POA: Insufficient documentation

## 2018-07-11 DIAGNOSIS — Z87891 Personal history of nicotine dependence: Secondary | ICD-10-CM | POA: Insufficient documentation

## 2018-07-11 DIAGNOSIS — Z79899 Other long term (current) drug therapy: Secondary | ICD-10-CM | POA: Insufficient documentation

## 2018-07-11 MED ORDER — LIDOCAINE VISCOUS HCL 2 % MT SOLN: 15.0000 mL | OROMUCOSAL | 0 refills | Status: DC | PRN

## 2018-07-11 MED ORDER — MELOXICAM 15 MG PO TABS: 15.0000 mg | ORAL_TABLET | Freq: Every day | ORAL | 0 refills | Status: DC

## 2018-07-11 MED ORDER — PENICILLIN V POTASSIUM 500 MG PO TABS: 500.0000 mg | ORAL_TABLET | Freq: Four times a day (QID) | ORAL | 0 refills | Status: AC

## 2018-07-11 NOTE — Discharge Instructions (Signed)
Please read and follow all provided instructions.  Your diagnoses today include:  1. Pain, dental     The exam and treatment you received today has been provided on an emergency basis only. This is not a substitute for complete medical or dental care. This problem will not resolve on its own without the care of a dentist.  Tests performed today include: Vital signs. See below for your results today.   Medications prescribed:   Take any prescribed medications only as directed. Use ibuprofen or naproxen for pain. Use the viscous lidocaine for mouth pain. Swish with the lidocaine and spit it out. Do not swallow it.  Please take all of your antibiotics until finished!   You may develop abdominal discomfort or diarrhea from the antibiotic.  You may help offset this with probiotics which you can buy or get in yogurt. Do not eat or take the probiotics until 2 hours after your antibiotic. Do not take your medicine if develop an itchy rash, swelling in your mouth or lips, or difficulty breathing.   Home care instructions:  Follow any educational materials contained in this packet.  Follow-up instructions: Please follow-up with your dentist for further evaluation of your symptoms.   Dental Assistance: See handout for dental referrals  Return instructions:  Please return to the Emergency Department if you experience worsening symptoms. Please return if you develop a fever, you develop more swelling in your face or neck, you have trouble breathing or swallowing food. Please return if you have any other emergent concerns.  Additional Information:  Your vital signs today were: BP (!) 161/107 (BP Location: Right Arm)    Pulse 68    Temp 98 F (36.7 C) (Oral)    Resp 16    Ht 5' 9.5" (1.765 m)    Wt 108.9 kg    SpO2 99%    BMI 34.93 kg/m  If your blood pressure (BP) was elevated above 135/85 this visit, please have this repeated by your doctor within one month. --------------

## 2018-07-11 NOTE — ED Triage Notes (Signed)
Pt reports broken tooth to right lower tooth. Is working on getting appt to have tooth removed. Increased pain and swelling this week.

## 2018-07-11 NOTE — ED Provider Notes (Signed)
[facility] EMERGENCY DEPARTMENT Provider Note   CSN: [id number] Arrival date & time: 07/11/18  1127     History   Chief Complaint Chief Complaint  Patient presents with  . Dental Pain    HPI Kevin Lloyd is a 42 y.o. male with a history of GERD who presents emergency department today for right lower dental pain.  Patient reports he has had intermittent pain of his right lower molar for the last several months.  He states that he typically receives any biotics for this and his symptoms resolved.  He reports his pain is increased over the last 1-2 days however and he has started seeking a dentist/oral surgeon to remove this.  He notes he does not have a dentist at this time.  He reports this is typical of his previous pain.  He has been taking ibuprofen for symptoms with mild relief. Denies fever, chills, voice change, inability to control secretions, nausea/vomiting, facial swelling, dysphagia, odynophagia, drainage or trauma  HPI  Past Medical History:  Diagnosis Date  . Acid reflux     Patient Active Problem List   Diagnosis Date Noted  . Right ankle injury 11/22/2015  . Right shoulder injury 11/10/2014    Past Surgical History:  Procedure Laterality Date  . WISDOM TOOTH EXTRACTION          Home Medications    Prior to Admission medications   Medication Sig Start Date End Date Taking? Authorizing Provider  naproxen (NAPROSYN) 500 MG tablet Take 1 tablet (500 mg total) by mouth 2 (two) times daily. 01/19/18   Petrucelli, Samantha R, PA-C  OMEPRAZOLE PO Take by mouth.    [provider]  oxyCODONE-acetaminophen (PERCOCET/ROXICET) 5-325 MG per tablet Take 1 tablet by mouth every 6 (six) hours as needed for severe pain. Patient not taking: Reported on 10/14/2015 11/08/14   Lenda KelpHudnall, Shane R, MD  traMADol (ULTRAM) 50 MG tablet Take 1 tablet (50 mg total) by mouth every 6 (six) hours as needed. 04/22/17   Ward, Chase PicketJaime Pilcher, PA-C  triamcinolone (NASACORT  ALLERGY 24HR) 55 MCG/ACT AERO nasal inhaler Place 2 sprays into the nose daily as needed. Congestion    [provider]    Family History No family history on file.  Social History Social History   Tobacco Use  . Smoking status: Former Smoker    Packs/day: 1.00    Types: Cigarettes  . Smokeless tobacco: Never Used  Substance Use Topics  . Alcohol use: No    Alcohol/week: 0.0 standard drinks  . Drug use: No     Allergies   Codeine and Azithromycin   Review of Systems Review of Systems  Constitutional: Negative for fever.  HENT: Positive for dental problem. Negative for drooling and trouble swallowing.   Gastrointestinal: Negative for nausea and vomiting.  All other systems reviewed and are negative.    Physical Exam Updated Vital Signs BP (!) 161/107 (BP Location: Right Arm)   Pulse 68   Temp 98 F (36.7 C) (Oral)   Resp 16   Ht 5' 9.5" (1.765 m)   Wt 108.9 kg   SpO2 99%   BMI 34.93 kg/m   Physical Exam  Constitutional: He appears well-developed and well-nourished.  HENT:  Head: Normocephalic and atraumatic.  Right Ear: External ear normal.  Left Ear: External ear normal.  Nose: Nose normal. Right sinus exhibits no maxillary sinus tenderness and no frontal sinus tenderness. Left sinus exhibits no maxillary sinus tenderness and no frontal sinus tenderness.  Mouth/Throat:    The patient has normal phonation and is in control of secretions. No stridor.  Midline uvula without edema. Soft palate rises symmetrically.  No tonsillar erythema or exudates. No PTA. Tongue protrusion is normal. No trismus. No creptius on neck palpation. Patient with cavity of lower molar on the right as indicated in diagram. No gingival erythema or fluctuance noted. No facial swelling. No floor edema. Mucus membranes moist.   Eyes: Conjunctivae are normal. Right eye exhibits no discharge. Left eye exhibits no discharge. No scleral icterus.  No edema or swelling.   Neck:  No  nuchal rigidity or meningismus. No crepitus on neck palpation. No submandibular or submental edema.   Pulmonary/Chest: Effort normal. No respiratory distress.  Neurological: He is alert.  Skin: Skin is warm and dry. Capillary refill takes less than 2 seconds. No pallor.  Psychiatric: He has a normal mood and affect.  Nursing note and vitals reviewed.    ED Treatments / Results  Labs (all labs ordered are listed, but only abnormal results are displayed) Labs Reviewed - No data to display  EKG None  Radiology No results found.  Procedures Procedures (including critical care time)  Medications Ordered in ED Medications - No data to display   Initial Impression / Assessment and Plan / ED Course  I have reviewed the triage vital signs and the nursing notes.  Pertinent labs & imaging results that were available during my care of the patient were reviewed by me and considered in my medical decision making (see chart for details).     Patient with toothache.  No gross abscess.  Exam unconcerning for Ludwig's angina or spread of infection.  Will treat with penicillin and anti-inflammatories medicine. Will also give viscous lidocaine. Offered dental block while in the department but patient refused at this time. Return precautions discussed.  Urged patient to follow-up with dentist. Resources given. Referral to on call oral surgeon given ([place]).   Final Clinical Impressions(s) / ED Diagnoses   Final diagnoses:  Pain, dental    ED Discharge Orders         Ordered    penicillin v potassium (VEETID) 500 MG tablet  4 times daily     07/11/18 1250    lidocaine (XYLOCAINE) 2 % solution  As needed     07/11/18 1250    meloxicam (MOBIC) 15 MG tablet  Daily     07/11/18 1250           Princella Pellegrini 07/11/18 1252    Vanetta Mulders, MD 07/11/18 1521

## 2020-12-09 ENCOUNTER — Encounter (HOSPITAL_COMMUNITY): Payer: Self-pay

## 2020-12-09 ENCOUNTER — Emergency Department (HOSPITAL_COMMUNITY)
Admission: EM | Admit: 2020-12-09 | Discharge: 2020-12-10 | Disposition: A | Discharge status: Home / Self Care | Payer: 59 | Attending: Emergency Medicine | Admitting: Emergency Medicine

## 2020-12-09 DIAGNOSIS — R109 Unspecified abdominal pain: Secondary | ICD-10-CM | POA: Diagnosis present

## 2020-12-09 DIAGNOSIS — Z87891 Personal history of nicotine dependence: Secondary | ICD-10-CM | POA: Insufficient documentation

## 2020-12-09 DIAGNOSIS — N201 Calculus of ureter: Secondary | ICD-10-CM | POA: Insufficient documentation

## 2020-12-09 LAB — COMPREHENSIVE METABOLIC PANEL
ALT: 24 U/L (ref 0–44)
AST: 21 U/L (ref 15–41)
Albumin: 4.4 g/dL (ref 3.5–5.0)
Alkaline Phosphatase: 85 U/L (ref 38–126)
Anion gap: 9 (ref 5–15)
BUN: 16 mg/dL (ref 6–20)
CO2: 23 mmol/L (ref 22–32)
Calcium: 9.2 mg/dL (ref 8.9–10.3)
Chloride: 106 mmol/L (ref 98–111)
Creatinine, Ser: 0.86 mg/dL (ref 0.61–1.24)
GFR, Estimated: >60 mL/min (ref >60)
Glucose, Bld: 126 mg/dL — ABNORMAL HIGH (ref 70–99)
Potassium: 4 mmol/L (ref 3.5–5.1)
Sodium: 138 mmol/L (ref 135–145)
Total Bilirubin: 1.1 mg/dL (ref 0.3–1.2)
Total Protein: 7.4 g/dL (ref 6.5–8.1)

## 2020-12-09 LAB — LIPASE, BLOOD: Lipase: 30 U/L (ref 11–51)

## 2020-12-09 LAB — CBC
HCT: 47.8 % (ref 39.0–52.0)
Hemoglobin: 16 g/dL (ref 13.0–17.0)
MCH: 29.6 pg (ref 26.0–34.0)
MCHC: 33.5 g/dL (ref 30.0–36.0)
MCV: 88.4 fL (ref 80.0–100.0)
Platelets: 272 10*3/uL (ref 150–400)
RBC: 5.41 MIL/uL (ref 4.22–5.81)
RDW: 12.1 % (ref 11.5–15.5)
WBC: 10.6 10*3/uL — ABNORMAL HIGH (ref 4.0–10.5)
nRBC: 0 % (ref 0.0–0.2)

## 2020-12-09 LAB — URINALYSIS, ROUTINE W REFLEX MICROSCOPIC
Bacteria, UA: NONE SEEN
Bilirubin Urine: NEGATIVE
Glucose, UA: NEGATIVE mg/dL
Ketones, ur: NEGATIVE mg/dL
Leukocytes,Ua: NEGATIVE
Nitrite: NEGATIVE
Protein, ur: 100 mg/dL — AB
RBC / HPF: >50 RBC/hpf — ABNORMAL HIGH (ref 0–5)
Specific Gravity, Urine: 1.027 (ref 1.005–1.030)
pH: 5 (ref 5.0–8.0)

## 2020-12-09 NOTE — ED Triage Notes (Signed)
Pt reports that he has been having lower abd pain and L flank pain since this evening. Pt reports that he has been having blood in his urine for the past weeks, recently treated for UTI

## 2020-12-09 NOTE — ED Notes (Signed)
Unable to obtain vitals pt outside

## 2020-12-10 ENCOUNTER — Emergency Department (HOSPITAL_COMMUNITY): Payer: 59

## 2020-12-10 ENCOUNTER — Other Ambulatory Visit: Payer: Self-pay

## 2020-12-10 MED ORDER — TAMSULOSIN HCL 0.4 MG PO CAPS: 0.4000 mg | ORAL_CAPSULE | Freq: Every day | ORAL | 0 refills | Status: DC

## 2020-12-10 MED ORDER — ONDANSETRON 4 MG PO TBDP: 4.0000 mg | ORAL_TABLET | Freq: Three times a day (TID) | ORAL | 0 refills | Status: DC | PRN

## 2020-12-10 MED ORDER — OXYCODONE HCL 5 MG PO TABS: 5.0000 mg | ORAL_TABLET | Freq: Four times a day (QID) | ORAL | 0 refills | Status: DC | PRN

## 2020-12-10 NOTE — Discharge Instructions (Signed)
Thank you for allowing me to care for you today in the Emergency Department.   Take 650 mg of Tylenol or 600 mg of ibuprofen with food every 6 hours for pain.  You can alternate between these 2 medications every 3 hours if your pain returns.  For instance, you can take Tylenol at noon, followed by a dose of ibuprofen at 3, followed by second dose of Tylenol and 6.  For severe, uncontrollable pain, take 1 tablet of Roxicodone every 6 hours.  Do not work or drive while taking this medication as it causes you to be impaired.  Let 1 tablet of Zofran dissolve under tongue every 8 hours as needed for nausea or vomiting.  Take 1 tablet of Flomax daily until you pass the stone.  You can use the urine strainer to collect the stone and to follow-up with primary care as sometimes they like to send the stones off for analysis.  Return to the emergency department if you develop uncontrollable pain, uncontrollable vomiting, if you have not passed the stone and you start having a temperature of greater than 100.4 F, or other new, concerning symptoms

## 2020-12-10 NOTE — ED Provider Notes (Signed)
[facility] EMERGENCY DEPARTMENT Provider Note   CSN: [id number] Arrival date & time: 12/09/20  1858     History Chief Complaint  Patient presents with  . Abdominal Pain    Kevin Lloyd is a 45 y.o. male who presents to the emergency department with a chief complaint of left flank pain.  The patient reports sudden onset of left flank pain.  Pain was constant after onset until it suddenly resolved while he was waiting in the waiting room earlier tonight.  He characterizes the pain as sharp and stabbing and the pain radiates into his left lower abdomen.  He reports that he is having a difficult time getting comfortable with the pain.  No other known aggravating or alleviating factors.  He also had an episode of hematuria after arriving in the ER.  No decreased urine output, fever, chills, vomiting, diarrhea, constipation, testicular pain or swelling, penile discharge, chest pain, shortness of breath.  The patient states that he was treated for a UTI after he developed hematuria 2 weeks ago.  No history of UTIs.  He also notes that approximately 2 weeks ago at his job he was moving a heavy piece of freight.  Reports that he was using a considerable amount of force to move the freight across the floor.  He states that the right then got stuck and it suddenly stopped moving; however, the patient already has significant momentum in place to try and move the right and ultimately hit his lower abdomen against the side of the freight with considerable force.  He reports that he was diagnosed with the UTI a few days after this initially happened.  No concerns for STIs.  No history of kidney stones.  No familial history of bladder or kidney cancer.  No treatment prior to arrival.  The history is provided by the patient and medical records. No language interpreter was used.       Past Medical History:  Diagnosis Date  . Acid reflux     Patient Active Problem List    Diagnosis Date Noted  . Right ankle injury 11/22/2015  . Right shoulder injury 11/10/2014    Past Surgical History:  Procedure Laterality Date  . WISDOM TOOTH EXTRACTION         No family history on file.  Social History   Tobacco Use  . Smoking status: Former Smoker    Packs/day: 1.00    Types: Cigarettes  . Smokeless tobacco: Never Used  Substance Use Topics  . Alcohol use: No    Alcohol/week: 0.0 standard drinks  . Drug use: No    Home Medications Prior to Admission medications   Medication Sig Start Date End Date Taking? Authorizing Provider  ondansetron (ZOFRAN ODT) 4 MG disintegrating tablet Take 1 tablet (4 mg total) by mouth every 8 (eight) hours as needed. 12/10/20  Yes McDonald, Mia A, PA-C  oxyCODONE (ROXICODONE) 5 MG immediate release tablet Take 1 tablet (5 mg total) by mouth every 6 (six) hours as needed for severe pain. 12/10/20  Yes McDonald, Mia A, PA-C  tamsulosin (FLOMAX) 0.4 MG CAPS capsule Take 1 capsule (0.4 mg total) by mouth daily. 12/10/20  Yes McDonald, Mia A, PA-C  lidocaine (XYLOCAINE) 2 % solution Use as directed 15 mLs in the mouth or throat as needed for mouth pain. 07/11/18   Maczis, Elmer Sow, PA-C  meloxicam (MOBIC) 15 MG tablet Take 1 tablet (15 mg total) by mouth daily. 07/11/18   Maczis, Elmer Sow,  PA-C  naproxen (NAPROSYN) 500 MG tablet Take 1 tablet (500 mg total) by mouth 2 (two) times daily. 01/19/18   Petrucelli, Samantha R, PA-C  OMEPRAZOLE PO Take by mouth.    [provider]  traMADol (ULTRAM) 50 MG tablet Take 1 tablet (50 mg total) by mouth every 6 (six) hours as needed. 04/22/17   Ward, Chase PicketJaime Pilcher, PA-C  triamcinolone (NASACORT ALLERGY 24HR) 55 MCG/ACT AERO nasal inhaler Place 2 sprays into the nose daily as needed. Congestion    [provider]    Allergies    Codeine and Azithromycin  Review of Systems   Review of Systems  Constitutional: Negative for appetite change and fever.  HENT: Negative for  congestion and sore throat.   Respiratory: Negative for shortness of breath.   Cardiovascular: Negative for chest pain.  Gastrointestinal: Positive for abdominal pain. Negative for blood in stool, diarrhea, nausea and vomiting.  Genitourinary: Positive for flank pain and hematuria. Negative for decreased urine volume, difficulty urinating, dysuria, frequency, genital sores, penile discharge, penile pain, penile swelling, scrotal swelling and urgency.  Musculoskeletal: Negative for back pain and neck pain.  Skin: Negative for rash.  Allergic/Immunologic: Negative for immunocompromised state.  Neurological: Negative for dizziness, seizures, syncope, weakness, numbness and headaches.  Psychiatric/Behavioral: Negative for confusion.    Physical Exam Updated Vital Signs BP 127/87 (BP Location: Right Arm)   Pulse 77   Temp 98.4 F (36.9 C) (Oral)   Resp 16   SpO2 98%   Physical Exam Vitals and nursing note reviewed.  Constitutional:      General: He is not in acute distress.    Appearance: He is well-developed. He is not ill-appearing, toxic-appearing or diaphoretic.  HENT:     Head: Normocephalic.  Eyes:     Conjunctiva/sclera: Conjunctivae normal.  Cardiovascular:     Rate and Rhythm: Normal rate and regular rhythm.     Pulses: Normal pulses.     Heart sounds: Normal heart sounds. No murmur heard. No friction rub. No gallop.   Pulmonary:     Effort: Pulmonary effort is normal. No respiratory distress.     Breath sounds: No stridor. No wheezing, rhonchi or rales.  Chest:     Chest wall: No tenderness.  Abdominal:     General: There is no distension.     Palpations: Abdomen is soft. There is no mass.     Tenderness: There is no abdominal tenderness. There is no right CVA tenderness, left CVA tenderness, guarding or rebound.     Hernia: No hernia is present.     Comments: Well-appearing.  No acute distress.  No CVA tenderness bilaterally.  Abdomen is soft, nontender,  nondistended.  Normoactive bowel sounds.  No peritoneal signs.  Musculoskeletal:     Cervical back: Neck supple.  Skin:    General: Skin is warm and dry.  Neurological:     Mental Status: He is alert.  Psychiatric:        Behavior: Behavior normal.     ED Results / Procedures / Treatments   Labs (all labs ordered are listed, but only abnormal results are displayed) Labs Reviewed  COMPREHENSIVE METABOLIC PANEL - Abnormal; Notable for the following components:      Result Value   Glucose, Bld 126 (*)    All other components within normal limits  CBC - Abnormal; Notable for the following components:   WBC 10.6 (*)    All other components within normal limits  URINALYSIS, ROUTINE W  REFLEX MICROSCOPIC - Abnormal; Notable for the following components:   Color, Urine AMBER (*)    APPearance HAZY (*)    Hgb urine dipstick LARGE (*)    Protein, ur 100 (*)    RBC / HPF >50 (*)    All other components within normal limits  LIPASE, BLOOD    EKG None  Radiology CT Renal Stone Study  Result Date: 12/10/2020 CLINICAL DATA:  45 year old male with lower abdominal and left flank pain. Intermittent gross hematuria. Recently treated for UTI. EXAM: CT ABDOMEN AND PELVIS WITHOUT CONTRAST TECHNIQUE: Multidetector CT imaging of the abdomen and pelvis was performed following the standard protocol without IV contrast. COMPARISON:  None. FINDINGS: Lower chest: Negative.  No pericardial or pleural effusion. Hepatobiliary: Negative noncontrast liver and gallbladder. Pancreas: Negative. Spleen: Negative. Adrenals/Urinary Tract: Normal adrenal glands. Negative noncontrast right kidney and right ureter. Mild to moderate left hydronephrosis and perinephric stranding. No left intrarenal calculus. Mild to moderate left hydroureter and periureteral stranding which continues into the pelvis. Just inside the urinary bladder near the left ureterovesical junction is a mildly lobulated 5 mm calculus (series 3, image  83 and coronal image 67). Minor circumferential bladder wall thickening otherwise. Pelvic phleboliths but no other urinary calculus. Stomach/Bowel: Negative large bowel aside from redundant sigmoid. Normal retrocecal appendix. Negative terminal ileum. No dilated small bowel. Decompressed and negative stomach, duodenum. No free air or free fluid. Vascular/Lymphatic: Normal caliber abdominal aorta. Mild Aortoiliac calcified atherosclerosis. Vascular patency is not evaluated in the absence of IV contrast. No lymphadenopathy. Reproductive: Negative. Other: No pelvic free fluid. Musculoskeletal: Negative. IMPRESSION: 1. Acute obstructive uropathy on the left with a 5 mm calculus located just inside the bladder near the left UVJ. No other urinary calculus identified. 2. Otherwise negative noncontrast CT Abdomen and Pelvis; Mild Aortic Atherosclerosis (ICD10-I70.0). Electronically Signed   By: Odessa Fleming M.D.   On: 12/10/2020 06:36    Procedures Procedures   Medications Ordered in ED Medications - No data to display  ED Course  I have reviewed the triage vital signs and the nursing notes.  Pertinent labs & imaging results that were available during my care of the patient were reviewed by me and considered in my medical decision making (see chart for details).    MDM Rules/Calculators/A&P                          45 year old male with history of GERD presenting with left flank pain and hematuria, onset earlier today.  No constitutional symptoms.  No N/V/D.  Notably, the patient had hematuria approximately 2 weeks ago and was treated for UTI after having an outpatient urinalysis that was positive for nitrates.  Just prior to developing hematuria from the UTI, he was involved in an incident at work with blunt trauma to the lower abdomen and pelvic region.   Vital signs are stable.  Physical exam is unremarkable.  He has no reproducible tenderness to palpation to the abdomen or flank.  Differential diagnosis  includes ureterolithiasis, obstructive uropathy, UTI, pyelonephritis, structural trauma to the bladder or ureters, urosepsis, bladder neoplasm.  Borderline leukocytosis.  No metabolic derangements.  UA with RBCs and hemoglobinuria.  There is also mild proteinuria.  No evidence of infection.  Given patient's presentation, I am strongly concerned for ureterolithiasis.  However, patient has no history of similar.  CT stone study was obtained, which demonstrated a 5 mm calculus just inside the bladder near the left UVJ.  Patient updated.  He remains pain-free and in no acute distress.  Toradol offered, but patient declined.  Will discharge patient home with strainer, Flomax, pain medication, and Zofran.  He was advised to follow-up with urology of this bone does not spontaneously pass within 72 hours.  ER return precautions given.  He is hemodynamically stable in no acute distress.  Safer discharge home with outpatient follow-up as indicated.  Final Clinical Impression(s) / ED Diagnoses Final diagnoses:  Ureterolithiasis    Rx / DC Orders ED Discharge Orders         Ordered    oxyCODONE (ROXICODONE) 5 MG immediate release tablet  Every 6 hours PRN        12/10/20 0712    ondansetron (ZOFRAN ODT) 4 MG disintegrating tablet  Every 8 hours PRN        12/10/20 0712    tamsulosin (FLOMAX) 0.4 MG CAPS capsule  Daily        12/10/20 0712           Frederik Pear A, PA-C 12/10/20 0932    Sabas Sous, MD 12/15/20 [phone]

## 2021-04-27 ENCOUNTER — Encounter (HOSPITAL_BASED_OUTPATIENT_CLINIC_OR_DEPARTMENT_OTHER): Payer: Self-pay

## 2021-04-27 ENCOUNTER — Other Ambulatory Visit: Payer: Self-pay

## 2021-04-27 ENCOUNTER — Emergency Department (HOSPITAL_BASED_OUTPATIENT_CLINIC_OR_DEPARTMENT_OTHER): Payer: 59 | Attending: Emergency Medicine

## 2021-04-27 ENCOUNTER — Emergency Department (HOSPITAL_BASED_OUTPATIENT_CLINIC_OR_DEPARTMENT_OTHER)
Admission: EM | Admit: 2021-04-27 | Discharge: 2021-04-27 | Disposition: A | Discharge status: Home / Self Care | Payer: Worker's Compensation | Attending: Emergency Medicine | Admitting: Emergency Medicine

## 2021-04-27 DIAGNOSIS — X500XXA Overexertion from strenuous movement or load, initial encounter: Secondary | ICD-10-CM | POA: Diagnosis not present

## 2021-04-27 DIAGNOSIS — F17210 Nicotine dependence, cigarettes, uncomplicated: Secondary | ICD-10-CM | POA: Diagnosis not present

## 2021-04-27 DIAGNOSIS — M545 Low back pain, unspecified: Secondary | ICD-10-CM | POA: Diagnosis present

## 2021-04-27 DIAGNOSIS — Y93F2 Activity, caregiving, lifting: Secondary | ICD-10-CM | POA: Diagnosis not present

## 2021-04-27 HISTORY — DX: Calculus of kidney: N20.0

## 2021-04-27 MED ORDER — ACETAMINOPHEN 500 MG PO TABS
1000.0000 mg | ORAL_TABLET | Freq: Once | ORAL | Status: AC
  Administered 2021-04-27: 1000 mg via ORAL
  Filled 2021-04-27: qty 2

## 2021-04-27 MED ORDER — OXYCODONE HCL 5 MG PO TABS
5.0000 mg | ORAL_TABLET | Freq: Once | ORAL | Status: AC
  Administered 2021-04-27: 5 mg via ORAL
  Filled 2021-04-27: qty 1

## 2021-04-27 MED ORDER — DIAZEPAM 5 MG PO TABS
5.0000 mg | ORAL_TABLET | Freq: Once | ORAL | Status: AC
  Administered 2021-04-27: 5 mg via ORAL
  Filled 2021-04-27: qty 1

## 2021-04-27 MED ORDER — IBUPROFEN 800 MG PO TABS
800.0000 mg | ORAL_TABLET | Freq: Once | ORAL | Status: AC
  Administered 2021-04-27: 800 mg via ORAL
  Filled 2021-04-27: qty 1

## 2021-04-27 MED ORDER — KETOROLAC TROMETHAMINE 15 MG/ML IJ SOLN: 15.0000 mg | Freq: Once | INTRAMUSCULAR | Status: DC

## 2021-04-27 NOTE — Discharge Instructions (Addendum)

## 2021-04-27 NOTE — ED Triage Notes (Signed)
Pt c/o lower back pain ~530p while lifting at work-NAD-to triage in w.c

## 2021-04-27 NOTE — ED Provider Notes (Signed)
[facility] EMERGENCY DEPARTMENT Provider Note   CSN: [id number] Arrival date & time: 04/27/21  1858     History Chief Complaint  Patient presents with   Back Pain    Kahleel Fadeley is a 45 y.o. male.  45 yo M with a chief complaint of low back pain.  The patient had lifted something heavy today and when she went to stand up from a squatted position he felt a pop in his left-sided low back and had sudden severe pain.  Has been worried to try and stand afterwards.  Denies any radiation of the pain.  Denies trauma.  Denies loss of bowel or bladder denies loss of fixation denies numbness or weakness to his legs.  The history is provided by the patient.  Back Pain Location:  Lumbar spine Quality:  Aching Radiates to:  Does not radiate Pain severity:  Moderate Onset quality:  Sudden Duration:  2 hours Timing:  Constant Progression:  Unchanged Chronicity:  New Relieved by:  Nothing Worsened by:  Palpation, touching and twisting Ineffective treatments:  None tried Associated symptoms: no abdominal pain, no chest pain, no fever and no headaches       Past Medical History:  Diagnosis Date   Acid reflux    Kidney stone     Patient Active Problem List   Diagnosis Date Noted   Right ankle injury 11/22/2015   Right shoulder injury 11/10/2014    Past Surgical History:  Procedure Laterality Date   WISDOM TOOTH EXTRACTION         No family history on file.  Social History   Tobacco Use   Smoking status: Every Day    Pack years: 0.00    Types: Cigarettes   Smokeless tobacco: Never  Substance Use Topics   Alcohol use: No    Alcohol/week: 0.0 standard drinks   Drug use: No    Home Medications Prior to Admission medications   Medication Sig Start Date End Date Taking? Authorizing Provider  lidocaine (XYLOCAINE) 2 % solution Use as directed 15 mLs in the mouth or throat as needed for mouth pain. 07/11/18   Maczis, Elmer Sow, PA-C  meloxicam (MOBIC) 15 MG  tablet Take 1 tablet (15 mg total) by mouth daily. 07/11/18   Maczis, Elmer Sow, PA-C  naproxen (NAPROSYN) 500 MG tablet Take 1 tablet (500 mg total) by mouth 2 (two) times daily. 01/19/18   Petrucelli, Samantha R, PA-C  OMEPRAZOLE PO Take by mouth.    [provider]  ondansetron (ZOFRAN ODT) 4 MG disintegrating tablet Take 1 tablet (4 mg total) by mouth every 8 (eight) hours as needed. 12/10/20   McDonald, Mia A, PA-C  oxyCODONE (ROXICODONE) 5 MG immediate release tablet Take 1 tablet (5 mg total) by mouth every 6 (six) hours as needed for severe pain. 12/10/20   McDonald, Mia A, PA-C  tamsulosin (FLOMAX) 0.4 MG CAPS capsule Take 1 capsule (0.4 mg total) by mouth daily. 12/10/20   McDonald, Mia A, PA-C  traMADol (ULTRAM) 50 MG tablet Take 1 tablet (50 mg total) by mouth every 6 (six) hours as needed. 04/22/17   Ward, Chase Picket, PA-C  triamcinolone (NASACORT ALLERGY 24HR) 55 MCG/ACT AERO nasal inhaler Place 2 sprays into the nose daily as needed. Congestion    [provider]    Allergies    Azithromycin  Review of Systems   Review of Systems  Constitutional:  Negative for chills and fever.  HENT:  Negative for congestion and  facial swelling.   Eyes:  Negative for discharge and visual disturbance.  Respiratory:  Negative for shortness of breath.   Cardiovascular:  Negative for chest pain and palpitations.  Gastrointestinal:  Negative for abdominal pain, diarrhea and vomiting.  Musculoskeletal:  Positive for back pain. Negative for arthralgias and myalgias.  Skin:  Negative for color change and rash.  Neurological:  Negative for tremors, syncope and headaches.  Psychiatric/Behavioral:  Negative for confusion and dysphoric mood.    Physical Exam Updated Vital Signs BP (!) 131/100 (BP Location: Right Arm)   Pulse 86   Temp 98.2 F (36.8 C) (Oral)   Resp 18   Ht 5\' 9"  (1.753 m)   Wt 99.8 kg   SpO2 100%   BMI 32.49 kg/m   Physical Exam Vitals and nursing note  reviewed.  Constitutional:      Appearance: He is well-developed.  HENT:     Head: Normocephalic and atraumatic.  Eyes:     Pupils: Pupils are equal, round, and reactive to light.  Neck:     Vascular: No JVD.  Cardiovascular:     Rate and Rhythm: Normal rate and regular rhythm.     Heart sounds: No murmur heard.   No friction rub. No gallop.  Pulmonary:     Effort: No respiratory distress.     Breath sounds: No wheezing.  Abdominal:     General: There is no distension.     Tenderness: There is no abdominal tenderness. There is no guarding or rebound.  Musculoskeletal:        General: Tenderness present. Normal range of motion.     Cervical back: Normal range of motion and neck supple.     Comments: No midline spinal tenderness step-offs or deformities.  Mild pain along the left SI joint area.  Pulse motor and sensation intact distally bilaterally.  Reflexes are 2+ and equal there is no clonus.  Negative straight leg raise test bilaterally.  Skin:    Coloration: Skin is not pale.     Findings: No rash.  Neurological:     Mental Status: He is alert and oriented to person, place, and time.  Psychiatric:        Behavior: Behavior normal.    ED Results / Procedures / Treatments   Labs (all labs ordered are listed, but only abnormal results are displayed) Labs Reviewed - No data to display  EKG None  Radiology DG Lumbar Spine Complete  Result Date: 04/27/2021 CLINICAL DATA:  Midline low back pain which began at 1730 hours while lifting at work EXAM: LUMBAR SPINE - COMPLETE 4+ VIEW COMPARISON:  None. FINDINGS: Six non-rib-bearing lumbar type vertebrae are seen. Lowest fully formed disc space denoted as L5-S1. Levocurvature of the lumbar spine, apex L4. No acute vertebral body fracture or height loss. No evidence of static or traumatic listhesis. No visible spondylolysis. Minimal discogenic changes with some anterior spurring most pronounced L3-L5 with additional disc height loss  and facet arthropathy L6-S1. Normal bone mineralization. No worrisome osseous lesions. Bones of the pelvis appear intact and congruent. Vascular calcium noted in pelvis. No suspicious soft tissue abnormalities. IMPRESSION: Six lumbar levels, anatomic variant. No acute osseous abnormality. Mild degenerative changes as detailed above. Electronically Signed   By: 06/28/2021 M.D.   On: 04/27/2021 21:32    Procedures Procedures   Medications Ordered in ED Medications  acetaminophen (TYLENOL) tablet 1,000 mg (1,000 mg Oral Given 04/27/21 2125)  oxyCODONE (Oxy IR/ROXICODONE) immediate release tablet 5  mg (5 mg Oral Given 04/27/21 2125)  diazepam (VALIUM) tablet 5 mg (5 mg Oral Given 04/27/21 2125)  ibuprofen (ADVIL) tablet 800 mg (800 mg Oral Given 04/27/21 2126)    ED Course  I have reviewed the triage vital signs and the nursing notes.  Pertinent labs & imaging results that were available during my care of the patient were reviewed by me and considered in my medical decision making (see chart for details).    MDM Rules/Calculators/A&P                          45 yo M with a chief complaint of sudden onset left-sided low back pain after lifting something heavy.  Most likely muscular spasm based on history patient is reporting a sudden pop will obtain a plain film to evaluate for spontaneous fracture.  Viewed by me negative.  Discharge home.  9:44 PM:  I have discussed the diagnosis/risks/treatment options with the patient and family and believe the pt to be eligible for discharge home to follow-up with PCP. We also discussed returning to the ED immediately if new or worsening sx occur. We discussed the sx which are most concerning (e.g., sudden worsening pain, fever, inability to tolerate by mouth, cauda equina s/sx) that necessitate immediate return. Medications administered to the patient during their visit and any new prescriptions provided to the patient are listed below.  Medications given during  this visit Medications  acetaminophen (TYLENOL) tablet 1,000 mg (1,000 mg Oral Given 04/27/21 2125)  oxyCODONE (Oxy IR/ROXICODONE) immediate release tablet 5 mg (5 mg Oral Given 04/27/21 2125)  diazepam (VALIUM) tablet 5 mg (5 mg Oral Given 04/27/21 2125)  ibuprofen (ADVIL) tablet 800 mg (800 mg Oral Given 04/27/21 2126)     The patient appears reasonably screen and/or stabilized for discharge and I doubt any other medical condition or other [facility] requiring further screening, evaluation, or treatment in the ED at this time prior to discharge.   Final Clinical Impression(s) / ED Diagnoses Final diagnoses:  Acute left-sided low back pain without sciatica    Rx / DC Orders ED Discharge Orders     None        Melene Plan, DO 04/27/21 2144

## 2021-06-09 ENCOUNTER — Other Ambulatory Visit: Payer: Self-pay

## 2021-06-09 ENCOUNTER — Emergency Department (HOSPITAL_BASED_OUTPATIENT_CLINIC_OR_DEPARTMENT_OTHER): Payer: Worker's Compensation

## 2021-06-09 ENCOUNTER — Encounter (HOSPITAL_BASED_OUTPATIENT_CLINIC_OR_DEPARTMENT_OTHER): Payer: Self-pay | Admitting: Emergency Medicine

## 2021-06-09 ENCOUNTER — Emergency Department (HOSPITAL_BASED_OUTPATIENT_CLINIC_OR_DEPARTMENT_OTHER)
Admission: EM | Admit: 2021-06-09 | Discharge: 2021-06-09 | Disposition: A | Discharge status: Home / Self Care | Payer: Worker's Compensation | Attending: Emergency Medicine | Admitting: Emergency Medicine

## 2021-06-09 DIAGNOSIS — W231XXA Caught, crushed, jammed, or pinched between stationary objects, initial encounter: Secondary | ICD-10-CM | POA: Insufficient documentation

## 2021-06-09 DIAGNOSIS — S61411A Laceration without foreign body of right hand, initial encounter: Secondary | ICD-10-CM

## 2021-06-09 DIAGNOSIS — Y990 Civilian activity done for income or pay: Secondary | ICD-10-CM | POA: Insufficient documentation

## 2021-06-09 DIAGNOSIS — S61210A Laceration without foreign body of right index finger without damage to nail, initial encounter: Secondary | ICD-10-CM | POA: Diagnosis not present

## 2021-06-09 DIAGNOSIS — S60940A Unspecified superficial injury of right index finger, initial encounter: Secondary | ICD-10-CM | POA: Diagnosis present

## 2021-06-09 MED ORDER — LIDOCAINE HCL 2 % IJ SOLN
5.0000 mL | Freq: Once | INTRAMUSCULAR | Status: AC
  Administered 2021-06-09: 100 mg
  Filled 2021-06-09: qty 20

## 2021-06-09 MED ORDER — LIDOCAINE-EPINEPHRINE-TETRACAINE (LET) TOPICAL GEL
3.0000 mL | Freq: Once | TOPICAL | Status: AC
  Administered 2021-06-09: 3 mL via TOPICAL
  Filled 2021-06-09: qty 3

## 2021-06-09 NOTE — ED Provider Notes (Signed)
[facility] EMERGENCY DEPARTMENT Provider Note   CSN: [id number] Arrival date & time: 06/09/21  0101     History Chief Complaint  Patient presents with   Hand Injury    Kevin Lloyd is a 45 y.o. male.  The history is provided by the patient.  Hand Injury Kevin Lloyd is a 45 y.o. male who presents to the Emergency Department complaining of hand injury.  He presents to the ED for evaluation of right hand injury that occurred at work. His right hand got stuck between a piece of freight and a gate latch. This occurred to his right index finger. He complains of pain locally to the wound. His right-hand dominant. Last tetanus is unknown. He has no known medical problems. Symptoms are moderate and constant nature.    Past Medical History:  Diagnosis Date   Acid reflux    Kidney stone     Patient Active Problem List   Diagnosis Date Noted   Right ankle injury 11/22/2015   Right shoulder injury 11/10/2014    Past Surgical History:  Procedure Laterality Date   WISDOM TOOTH EXTRACTION         Family History  Problem Relation Age of Onset   Diabetes Other     Social History   Tobacco Use   Smoking status: Every Day    Packs/day: 0.75    Types: Cigarettes   Smokeless tobacco: Never  Vaping Use   Vaping Use: Never used  Substance Use Topics   Alcohol use: Yes    Comment: rare   Drug use: No    Home Medications Prior to Admission medications   Medication Sig Start Date End Date Taking? Authorizing Provider  lidocaine (XYLOCAINE) 2 % solution Use as directed 15 mLs in the mouth or throat as needed for mouth pain. 07/11/18   Maczis, Elmer Sow, PA-C  meloxicam (MOBIC) 15 MG tablet Take 1 tablet (15 mg total) by mouth daily. 07/11/18   Maczis, Elmer Sow, PA-C  naproxen (NAPROSYN) 500 MG tablet Take 1 tablet (500 mg total) by mouth 2 (two) times daily. 01/19/18   Petrucelli, Samantha R, PA-C  OMEPRAZOLE PO Take by mouth.    [provider]   ondansetron (ZOFRAN ODT) 4 MG disintegrating tablet Take 1 tablet (4 mg total) by mouth every 8 (eight) hours as needed. 12/10/20   McDonald, Mia A, PA-C  oxyCODONE (ROXICODONE) 5 MG immediate release tablet Take 1 tablet (5 mg total) by mouth every 6 (six) hours as needed for severe pain. 12/10/20   McDonald, Mia A, PA-C  tamsulosin (FLOMAX) 0.4 MG CAPS capsule Take 1 capsule (0.4 mg total) by mouth daily. 12/10/20   McDonald, Mia A, PA-C  traMADol (ULTRAM) 50 MG tablet Take 1 tablet (50 mg total) by mouth every 6 (six) hours as needed. 04/22/17   Ward, Chase Picket, PA-C  triamcinolone (NASACORT ALLERGY 24HR) 55 MCG/ACT AERO nasal inhaler Place 2 sprays into the nose daily as needed. Congestion    [provider]    Allergies    Azithromycin  Review of Systems   Review of Systems  All other systems reviewed and are negative.  Physical Exam Updated Vital Signs BP (!) 160/111 (BP Location: Left Arm)   Pulse 96   Temp 98.1 F (36.7 C) (Oral)   Resp 16   Ht 5' 9.5" (1.765 m)   Wt 102.1 kg   SpO2 99%   BMI 32.75 kg/m   Physical Exam Vitals and nursing note  reviewed.  Constitutional:      Appearance: He is well-developed.  HENT:     Head: Normocephalic and atraumatic.  Cardiovascular:     Rate and Rhythm: Normal rate and regular rhythm.  Pulmonary:     Effort: Pulmonary effort is normal. No respiratory distress.  Musculoskeletal:        General: No tenderness.     Comments: 2+ right radial pulse. There is a curved linear laceration over the dorsal aspect of the right second proximal digit. Does not cross the joints. Flexion/extension is intact throughout the digit with sensation to light touch intact throughout the digit.  Skin:    General: Skin is warm and dry.  Neurological:     Mental Status: He is alert and oriented to person, place, and time.  Psychiatric:        Behavior: Behavior normal.    ED Results / Procedures / Treatments   Labs (all labs ordered are  listed, but only abnormal results are displayed) Labs Reviewed - No data to display  EKG None  Radiology DG Finger Index Right  Result Date: 06/09/2021 CLINICAL DATA:  Crush injury to right index finger EXAM: RIGHT INDEX FINGER 2+V COMPARISON:  None. FINDINGS: No fracture or dislocation is seen. The joint spaces are preserved. Mild soft tissue swelling along the proximal phalanx. IMPRESSION: Negative. Electronically Signed   By: Charline Bills M.D.   On: 06/09/2021 02:59    Procedures .Marland KitchenLaceration Repair  Date/Time: 06/09/2021 6:21 AM Performed by: Tilden Fossa, MD Authorized by: Tilden Fossa, MD   Consent:    Consent obtained:  Verbal   Consent given by:  Patient   Risks discussed:  Infection and pain Universal protocol:    Patient identity confirmed:  Verbally with patient Anesthesia:    Anesthesia method:  Topical application and local infiltration   Topical anesthetic:  LET   Local anesthetic:  Lidocaine 2% w/o epi Laceration details:    Location:  Finger   Finger location:  R index finger   Length (cm):  2.5 Pre-procedure details:    Preparation:  Patient was prepped and draped in usual sterile fashion Treatment:    Area cleansed with:  Povidone-iodine, saline and soap and water   Amount of cleaning:  Extensive   Debridement:  None Skin repair:    Repair method:  Sutures   Suture size:  5-0   Suture material:  Prolene   Suture technique:  Simple interrupted   Number of sutures:  3 Approximation:    Approximation:  Close Repair type:    Repair type:  Simple Post-procedure details:    Procedure completion:  Tolerated   Medications Ordered in ED Medications  lidocaine-EPINEPHrine-tetracaine (LET) topical gel (3 mLs Topical Given 06/09/21 0532)  lidocaine (XYLOCAINE) 2 % (with pres) injection 100 mg (100 mg Infiltration Given 06/09/21 0532)    ED Course  I have reviewed the triage vital signs and the nursing notes.  Pertinent labs & imaging results  that were available during my care of the patient were reviewed by me and considered in my medical decision making (see chart for details).    MDM Rules/Calculators/A&P                          patient here for evaluation of hand laceration. Wound repaired per procedure note. No evidence of fracture, tendon injury or foreign body. Discussed with patient local wound care with outpatient follow-up and return precautions. Offered  patient tetanus booster and patient declines. Discussed risks of tetanus infection.  Final Clinical Impression(s) / ED Diagnoses Final diagnoses:  Laceration of right hand without foreign body, initial encounter    Rx / DC Orders ED Discharge Orders     None        Tilden Fossa, MD 06/09/21 [phone]

## 2021-06-09 NOTE — ED Triage Notes (Signed)
Pt states he crushed his right index finger at work  Pt has a laceration noted to his finger   Dressing in place  Able to bend finger  CMS checks wnl

## 2021-08-11 ENCOUNTER — Emergency Department (HOSPITAL_COMMUNITY): Payer: 59

## 2021-08-11 ENCOUNTER — Inpatient Hospital Stay (HOSPITAL_COMMUNITY)
Admission: EM | Admit: 2021-08-11 | Discharge: 2021-08-14 | DRG: 247 | Disposition: A | Discharge status: Home / Self Care | Payer: 59 | Attending: Internal Medicine | Admitting: Internal Medicine

## 2021-08-11 ENCOUNTER — Other Ambulatory Visit: Payer: Self-pay

## 2021-08-11 ENCOUNTER — Encounter (HOSPITAL_COMMUNITY): Payer: Self-pay | Admitting: *Deleted

## 2021-08-11 DIAGNOSIS — R079 Chest pain, unspecified: Secondary | ICD-10-CM | POA: Diagnosis not present

## 2021-08-11 DIAGNOSIS — K219 Gastro-esophageal reflux disease without esophagitis: Secondary | ICD-10-CM | POA: Diagnosis present

## 2021-08-11 DIAGNOSIS — Z791 Long term (current) use of non-steroidal anti-inflammatories (NSAID): Secondary | ICD-10-CM

## 2021-08-11 DIAGNOSIS — Z87442 Personal history of urinary calculi: Secondary | ICD-10-CM | POA: Diagnosis not present

## 2021-08-11 DIAGNOSIS — Z720 Tobacco use: Secondary | ICD-10-CM

## 2021-08-11 DIAGNOSIS — I10 Essential (primary) hypertension: Secondary | ICD-10-CM | POA: Diagnosis present

## 2021-08-11 DIAGNOSIS — Z79899 Other long term (current) drug therapy: Secondary | ICD-10-CM

## 2021-08-11 DIAGNOSIS — N529 Male erectile dysfunction, unspecified: Secondary | ICD-10-CM | POA: Diagnosis present

## 2021-08-11 DIAGNOSIS — E785 Hyperlipidemia, unspecified: Secondary | ICD-10-CM

## 2021-08-11 DIAGNOSIS — R7303 Prediabetes: Secondary | ICD-10-CM | POA: Diagnosis present

## 2021-08-11 DIAGNOSIS — I2582 Chronic total occlusion of coronary artery: Secondary | ICD-10-CM | POA: Diagnosis present

## 2021-08-11 DIAGNOSIS — Z833 Family history of diabetes mellitus: Secondary | ICD-10-CM

## 2021-08-11 DIAGNOSIS — I2510 Atherosclerotic heart disease of native coronary artery without angina pectoris: Secondary | ICD-10-CM | POA: Diagnosis present

## 2021-08-11 DIAGNOSIS — Z20822 Contact with and (suspected) exposure to covid-19: Secondary | ICD-10-CM | POA: Diagnosis present

## 2021-08-11 DIAGNOSIS — Z8249 Family history of ischemic heart disease and other diseases of the circulatory system: Secondary | ICD-10-CM

## 2021-08-11 DIAGNOSIS — I214 Non-ST elevation (NSTEMI) myocardial infarction: Secondary | ICD-10-CM | POA: Diagnosis present

## 2021-08-11 DIAGNOSIS — Z881 Allergy status to other antibiotic agents status: Secondary | ICD-10-CM | POA: Diagnosis not present

## 2021-08-11 DIAGNOSIS — Z955 Presence of coronary angioplasty implant and graft: Secondary | ICD-10-CM

## 2021-08-11 DIAGNOSIS — F17210 Nicotine dependence, cigarettes, uncomplicated: Secondary | ICD-10-CM | POA: Diagnosis present

## 2021-08-11 DIAGNOSIS — E782 Mixed hyperlipidemia: Secondary | ICD-10-CM | POA: Diagnosis not present

## 2021-08-11 HISTORY — DX: Non-ST elevation (NSTEMI) myocardial infarction: I21.4

## 2021-08-11 HISTORY — DX: Chest pain, unspecified: R07.9

## 2021-08-11 LAB — CBC WITH DIFFERENTIAL/PLATELET
Abs Immature Granulocytes: 0.03 10*3/uL (ref 0.00–0.07)
Basophils Absolute: 0.1 10*3/uL (ref 0.0–0.1)
Basophils Relative: 1 %
Eosinophils Absolute: 0.5 10*3/uL (ref 0.0–0.5)
Eosinophils Relative: 4 %
HCT: 49.1 % (ref 39.0–52.0)
Hemoglobin: 16.7 g/dL (ref 13.0–17.0)
Immature Granulocytes: 0 %
Lymphocytes Relative: 30 %
Lymphs Abs: 3.6 10*3/uL (ref 0.7–4.0)
MCH: 30.4 pg (ref 26.0–34.0)
MCHC: 34 g/dL (ref 30.0–36.0)
MCV: 89.4 fL (ref 80.0–100.0)
Monocytes Absolute: 1 10*3/uL (ref 0.1–1.0)
Monocytes Relative: 8 %
Neutro Abs: 6.8 10*3/uL (ref 1.7–7.7)
Neutrophils Relative %: 57 %
Platelets: 232 10*3/uL (ref 150–400)
RBC: 5.49 MIL/uL (ref 4.22–5.81)
RDW: 12.2 % (ref 11.5–15.5)
WBC: 11.9 10*3/uL — ABNORMAL HIGH (ref 4.0–10.5)
nRBC: 0 % (ref 0.0–0.2)

## 2021-08-11 LAB — TROPONIN I (HIGH SENSITIVITY)
Troponin I (High Sensitivity): 141 ng/L (ref <18)
Troponin I (High Sensitivity): 213 ng/L (ref <18)

## 2021-08-11 LAB — BASIC METABOLIC PANEL
Anion gap: 9 (ref 5–15)
BUN: 15 mg/dL (ref 6–20)
CO2: 25 mmol/L (ref 22–32)
Calcium: 9.9 mg/dL (ref 8.9–10.3)
Chloride: 105 mmol/L (ref 98–111)
Creatinine, Ser: 0.97 mg/dL (ref 0.61–1.24)
GFR, Estimated: >60 mL/min (ref >60)
Glucose, Bld: 119 mg/dL — ABNORMAL HIGH (ref 70–99)
Potassium: 4.4 mmol/L (ref 3.5–5.1)
Sodium: 139 mmol/L (ref 135–145)

## 2021-08-11 MED ORDER — ACETAMINOPHEN 325 MG PO TABS
650.0000 mg | ORAL_TABLET | ORAL | Status: DC | PRN
  Administered 2021-08-13: 650 mg via ORAL
  Filled 2021-08-11: qty 2

## 2021-08-11 MED ORDER — TAMSULOSIN HCL 0.4 MG PO CAPS
0.4000 mg | ORAL_CAPSULE | Freq: Every day | ORAL | Status: DC
  Administered 2021-08-12: 0.4 mg via ORAL
  Filled 2021-08-11: qty 1

## 2021-08-11 MED ORDER — ASPIRIN EC 81 MG PO TBEC
81.0000 mg | DELAYED_RELEASE_TABLET | Freq: Every day | ORAL | Status: DC
  Administered 2021-08-12: 81 mg via ORAL
  Filled 2021-08-11: qty 1

## 2021-08-11 MED ORDER — ASPIRIN 325 MG PO TABS
325.0000 mg | ORAL_TABLET | Freq: Every day | ORAL | Status: DC
  Administered 2021-08-11: 325 mg via ORAL
  Filled 2021-08-11: qty 1

## 2021-08-11 MED ORDER — ONDANSETRON HCL 4 MG/2ML IJ SOLN: 4.0000 mg | Freq: Four times a day (QID) | INTRAMUSCULAR | Status: DC | PRN

## 2021-08-11 MED ORDER — NICOTINE 21 MG/24HR TD PT24
21.0000 mg | MEDICATED_PATCH | Freq: Once | TRANSDERMAL | Status: AC
  Administered 2021-08-11: 21 mg via TRANSDERMAL
  Filled 2021-08-11: qty 1

## 2021-08-11 MED ORDER — HEPARIN (PORCINE) 25000 UT/250ML-% IV SOLN
1700.0000 [IU]/h | INTRAVENOUS | Status: DC
  Administered 2021-08-11: 1350 [IU]/h via INTRAVENOUS
  Administered 2021-08-12: 1600 [IU]/h via INTRAVENOUS
  Administered 2021-08-13: 1700 [IU]/h via INTRAVENOUS
  Filled 2021-08-11 (×3): qty 250

## 2021-08-11 MED ORDER — ATORVASTATIN CALCIUM 80 MG PO TABS
80.0000 mg | ORAL_TABLET | Freq: Every day | ORAL | Status: DC
  Administered 2021-08-12 – 2021-08-14 (×3): 80 mg via ORAL
  Filled 2021-08-11: qty 2
  Filled 2021-08-11 (×2): qty 1

## 2021-08-11 MED ORDER — HEPARIN BOLUS VIA INFUSION
4000.0000 [IU] | Freq: Once | INTRAVENOUS | Status: AC
  Administered 2021-08-11: 4000 [IU] via INTRAVENOUS
  Filled 2021-08-11: qty 4000

## 2021-08-11 MED ORDER — METOPROLOL TARTRATE 25 MG PO TABS
25.0000 mg | ORAL_TABLET | Freq: Two times a day (BID) | ORAL | Status: DC
  Administered 2021-08-12 – 2021-08-14 (×5): 25 mg via ORAL
  Filled 2021-08-11 (×5): qty 1

## 2021-08-11 MED ORDER — NITROGLYCERIN 0.4 MG SL SUBL: 0.4000 mg | SUBLINGUAL_TABLET | SUBLINGUAL | Status: DC | PRN

## 2021-08-11 NOTE — H&P (Signed)
Cardiology Admission History and Physical:   Patient ID: Kevin Lloyd MRN: [id number]; DOB: Jan 18, 1976   Admission date: 08/11/2021  PCP:  Pcp, No   [facility] Providers Cardiologist:  None        Chief Complaint:  chest pain  Patient Profile:   Kevin Lloyd is a 45 y.o. male with no PMH other than GERD who is being seen 08/11/2021 for the evaluation of chest pain at the request of ER Doc.  History of Present Illness:   Kevin Lloyd is a 45 y.o. male with no PMH other than GERD who is being seen 08/11/2021 for the evaluation of chest pain.  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 EMS came in, did EKG- and told him to go to the ER. His chest pain was gone when EMS came.   Patient came to the ER-got checked in, labs, EKG and left as the wait time was 6 hours. His trop was high 141- so was called again to come back. Patient's denies any current symptoms Family h/o CAD premature, dad- at age30, grandfather at later age Smokes 1-2ppd per day, no drug use.  EKG: sinus tachy, 101, no ischemic changes Trops 141/213   Past Medical History:  Diagnosis Date   Acid reflux    Chest pain 08/11/2021   Kidney stone    NSTEMI (non-ST elevated myocardial infarction) (HCC) 08/11/2021    Past Surgical History:  Procedure Laterality Date   WISDOM TOOTH EXTRACTION       Medications Prior to Admission: Prior to Admission medications   Medication Sig Start Date End Date Taking? Authorizing Provider  lidocaine (XYLOCAINE) 2 % solution Use as directed 15 mLs in the mouth or throat as needed for mouth pain. 07/11/18   Maczis, Elmer Sow, PA-C  meloxicam (MOBIC) 15 MG tablet Take 1 tablet (15 mg total) by mouth daily. 07/11/18   Maczis, Elmer Sow, PA-C  naproxen (NAPROSYN) 500 MG  tablet Take 1 tablet (500 mg total) by mouth 2 (two) times daily. 01/19/18   Petrucelli, Samantha R, PA-C  OMEPRAZOLE PO Take by mouth.    [provider]  ondansetron (ZOFRAN ODT) 4 MG disintegrating tablet Take 1 tablet (4 mg total) by mouth every 8 (eight) hours as needed. 12/10/20   McDonald, Mia A, PA-C  oxyCODONE (ROXICODONE) 5 MG immediate release tablet Take 1 tablet (5 mg total) by mouth every 6 (six) hours as needed for severe pain. 12/10/20   McDonald, Mia A, PA-C  tamsulosin (FLOMAX) 0.4 MG CAPS capsule Take 1 capsule (0.4 mg total) by mouth daily. 12/10/20   McDonald, Mia A, PA-C  traMADol (ULTRAM) 50 MG tablet Take 1 tablet (50 mg total) by mouth every 6 (six) hours as needed. 04/22/17   Ward, Chase Picket, PA-C  triamcinolone (NASACORT ALLERGY 24HR) 55 MCG/ACT AERO nasal inhaler Place 2 sprays into the nose daily as needed. Congestion    [provider]     Allergies:    Allergies  Allergen Reactions   Azithromycin Other (See Comments)    Caused his tongue to breakout with bumps    Social History:   Social History   Socioeconomic History   Marital status: Single    Spouse name: Not on file   Number of children: Not on  file   Years of education: Not on file   Highest education level: Not on file  Occupational History   Not on file  Tobacco Use   Smoking status: Every Day    Packs/day: 1.50    Types: Cigarettes   Smokeless tobacco: Never  Vaping Use   Vaping Use: Never used  Substance and Sexual Activity   Alcohol use: Yes    Comment: rare   Drug use: No   Sexual activity: Not on file  Other Topics Concern   Not on file  Social History Narrative   Not on file   Social Determinants of Health   Financial Resource Strain: Not on file  Food Insecurity: Not on file  Transportation Needs: Not on file  Physical Activity: Not on file  Stress: Not on file  Social Connections: Not on file  Intimate Partner Violence: Not on file    Family History:    The patient's family history includes Diabetes in an other family member.    ROS:  Please see the history of present illness.  All other ROS reviewed and negative.     Physical Exam/Data:   Vitals:   08/11/21 2027 08/11/21 2132 08/11/21 2200 08/11/21 2300  BP: (!) 156/102 (!) 162/105 (!) 141/95 (!) 139/93  Pulse: 96 95 85 76  Resp: 18 20 16 15   Temp: 98.2 F (36.8 C) 98.1 F (36.7 C)    TempSrc:  Oral    SpO2: 97% 100% 98% 99%  Weight:      Height:       No intake or output data in the 24 hours ending 08/11/21 2321 Last 3 Weights 08/11/2021 06/09/2021 04/27/2021  Weight (lbs) 225 lb 225 lb 220 lb  Weight (kg) 102.059 kg 102.059 kg 99.791 kg     Body mass index is 32.75 kg/m.  General:  Well nourished, well developed, in no acute distress HEENT: normal Neck: no JVD Vascular: No carotid bruits; Distal pulses 2+ bilaterally   Cardiac:  normal S1, S2; RRR; no murmur  Lungs:  clear to auscultation bilaterally, no wheezing, rhonchi or rales  Abd: soft, nontender, no hepatomegaly  Ext: no edema Musculoskeletal:  No deformities, BUE and BLE strength normal and equal Skin: warm and dry  Neuro:  CNs 2-12 intact, no focal abnormalities noted Psych:  Normal affect    EKG:  sinus tachy  Relevant CV Studies: pending  Laboratory Data:  High Sensitivity Troponin:   Recent Labs  Lab 08/11/21 1630 08/11/21 1851  TROPONINIHS 141* 213*      Chemistry Recent Labs  Lab 08/11/21 1630  NA 139  K 4.4  CL 105  CO2 25  GLUCOSE 119*  BUN 15  CREATININE 0.97  CALCIUM 9.9  GFRNONAA >60  ANIONGAP 9    No results for input(s): PROT, ALBUMIN, AST, ALT, ALKPHOS, BILITOT in the last 168 hours. Lipids No results for input(s): CHOL, TRIG, HDL, LABVLDL, LDLCALC, CHOLHDL in the last 168 hours. Hematology Recent Labs  Lab 08/11/21 1630  WBC 11.9*  RBC 5.49  HGB 16.7  HCT 49.1  MCV 89.4  MCH 30.4  MCHC 34.0  RDW 12.2  PLT 232   Thyroid No results for input(s): TSH,  FREET4 in the last 168 hours. BNPNo results for input(s): BNP, PROBNP in the last 168 hours.  DDimer No results for input(s): DDIMER in the last 168 hours.   Radiology/Studies:  DG Chest 2 View  Result Date: 08/11/2021 CLINICAL DATA:  PT states acute  onset chest pain that begin while he was driving a fork lift EXAM: CHEST - 2 VIEW COMPARISON:  None. FINDINGS: The cardiomediastinal contours are within normal limits. The lungs are clear. No pneumothorax or pleural effusion. No acute finding in the visualized skeleton. IMPRESSION: No acute cardiopulmonary finding. Electronically Signed   By: Emmaline Kluver M.D.   On: 08/11/2021 17:20     Assessment and Plan:   Chest pain/NSTEMI Family h/o premature CAD Tobacco use disorder HTNsive in the ER  Plan:   - admit to Cardiology service S/p aspirin full dose, no further chest pain, started heparin gtt - continue aspirin 81mg , metoprolol tart 25mg  BID, atorva 80mg  daily. Get lipid panel, A1c  - get ECHO in am. - NPO after MN on Sunday for LHC/CA.    Risk Assessment/Risk Scores:    TIMI Risk Score for Unstable Angina or Non-ST Elevation MI:   The patient's TIMI risk score is 2, which indicates a 8% risk of all cause mortality, new or recurrent myocardial infarction or need for urgent revascularization in the next 14 days.       Severity of Illness: The appropriate patient status for this patient is INPATIENT. Inpatient status is judged to be reasonable and necessary in order to provide the required intensity of service to ensure the patient's safety. The patient's presenting symptoms, physical exam findings, and initial radiographic and laboratory data in the context of their chronic comorbidities is felt to place them at high risk for further clinical deterioration. Furthermore, it is not anticipated that the patient will be medically stable for discharge from the hospital within 2 midnights of admission.   * I certify that at the point  of admission it is my clinical judgment that the patient will require inpatient hospital care spanning beyond 2 midnights from the point of admission due to high intensity of service, high risk for further deterioration and high frequency of surveillance required.*   For questions or updates, please contact [facility] Please consult [email or web address] for contact info under     Signed, , MD  08/11/2021 11:21 PM

## 2021-08-11 NOTE — ED Provider Notes (Addendum)
[facility] EMERGENCY DEPARTMENT Provider Note   CSN: [id number] Arrival date & time: 08/11/21  1614     History Chief Complaint  Patient presents with   Chest Pain    Kevin Lloyd is a 45 y.o. male.  45 year old male presents today for evaluation of substernal nonradiating chest pressure onset around 12:00 associated with diaphoresis and shortness of breath.  He denies lightheadedness, palpitations, nausea, or abdominal pain.  Denies prior episodes similar to this.  Patient was initially brought here had his labs drawn and then given extensive wait he had left to run an errand and was called back due to his abnormal labs.  Patient reports his chest pain resolved while in triage.  Currently he is chest pain-free.  Has about 26-pack-year smoking history.  Family history significant for CAD and dad and grandfather.  His dad passed away at age 20 from MI.  He does take Viagra and last dose was last night at 9 PM. PMH of GERD controlled with OTC antacids.   The history is provided by the patient. No language interpreter was used.      Past Medical History:  Diagnosis Date   Acid reflux    Kidney stone     Patient Active Problem List   Diagnosis Date Noted   Right ankle injury 11/22/2015   Right shoulder injury 11/10/2014    Past Surgical History:  Procedure Laterality Date   WISDOM TOOTH EXTRACTION         Family History  Problem Relation Age of Onset   Diabetes Other     Social History   Tobacco Use   Smoking status: Every Day    Packs/day: 1.50    Types: Cigarettes   Smokeless tobacco: Never  Vaping Use   Vaping Use: Never used  Substance Use Topics   Alcohol use: Yes    Comment: rare   Drug use: No    Home Medications Prior to Admission medications   Medication Sig Start Date End Date Taking? Authorizing Provider  lidocaine (XYLOCAINE) 2 % solution Use as directed 15 mLs in the mouth or throat as needed for mouth pain. 07/11/18    Maczis, Elmer Sow, PA-C  meloxicam (MOBIC) 15 MG tablet Take 1 tablet (15 mg total) by mouth daily. 07/11/18   Maczis, Elmer Sow, PA-C  naproxen (NAPROSYN) 500 MG tablet Take 1 tablet (500 mg total) by mouth 2 (two) times daily. 01/19/18   Petrucelli, Samantha R, PA-C  OMEPRAZOLE PO Take by mouth.    [provider]  ondansetron (ZOFRAN ODT) 4 MG disintegrating tablet Take 1 tablet (4 mg total) by mouth every 8 (eight) hours as needed. 12/10/20   McDonald, Mia A, PA-C  oxyCODONE (ROXICODONE) 5 MG immediate release tablet Take 1 tablet (5 mg total) by mouth every 6 (six) hours as needed for severe pain. 12/10/20   McDonald, Mia A, PA-C  tamsulosin (FLOMAX) 0.4 MG CAPS capsule Take 1 capsule (0.4 mg total) by mouth daily. 12/10/20   McDonald, Mia A, PA-C  traMADol (ULTRAM) 50 MG tablet Take 1 tablet (50 mg total) by mouth every 6 (six) hours as needed. 04/22/17   Ward, Chase Picket, PA-C  triamcinolone (NASACORT ALLERGY 24HR) 55 MCG/ACT AERO nasal inhaler Place 2 sprays into the nose daily as needed. Congestion    [provider]    Allergies    Azithromycin  Review of Systems   Review of Systems  Constitutional:  Positive for diaphoresis. Negative for fever.  Respiratory:  Positive for shortness of breath. Negative for chest tightness.   Cardiovascular:  Positive for chest pain. Negative for palpitations and leg swelling.  Gastrointestinal:  Negative for abdominal pain and nausea.  Neurological:  Negative for weakness and light-headedness.  Psychiatric/Behavioral:  Negative for agitation, behavioral problems and confusion.   All other systems reviewed and are negative.  Physical Exam Updated Vital Signs BP (!) 162/105 (BP Location: Left Arm)   Pulse 95   Temp 98.1 F (36.7 C) (Oral)   Resp 20   Ht 5' 9.5" (1.765 m)   Wt 102.1 kg   SpO2 100%   BMI 32.75 kg/m   Physical Exam Vitals and nursing note reviewed.  Constitutional:      General: He is not in acute  distress.    Appearance: He is obese. He is not ill-appearing.  HENT:     Head: Normocephalic and atraumatic.     Nose: Nose normal.  Eyes:     General: No scleral icterus.    Extraocular Movements: Extraocular movements intact.     Conjunctiva/sclera: Conjunctivae normal.  Cardiovascular:     Rate and Rhythm: Normal rate and regular rhythm.     Pulses: Normal pulses.     Heart sounds: Normal heart sounds.  Pulmonary:     Effort: Pulmonary effort is normal. No respiratory distress.     Breath sounds: Normal breath sounds. No wheezing or rales.  Abdominal:     General: There is no distension.     Tenderness: There is no abdominal tenderness.  Musculoskeletal:        General: Normal range of motion.     Cervical back: Normal range of motion.     Right lower leg: No edema.     Left lower leg: No edema.  Skin:    General: Skin is warm and dry.  Neurological:     General: No focal deficit present.     Mental Status: He is alert. Mental status is at baseline.    ED Results / Procedures / Treatments   Labs (all labs ordered are listed, but only abnormal results are displayed) Labs Reviewed  BASIC METABOLIC PANEL - Abnormal; Notable for the following components:      Result Value   Glucose, Bld 119 (*)    All other components within normal limits  CBC WITH DIFFERENTIAL/PLATELET - Abnormal; Notable for the following components:   WBC 11.9 (*)    All other components within normal limits  TROPONIN I (HIGH SENSITIVITY) - Abnormal; Notable for the following components:   Troponin I (High Sensitivity) 141 (*)    All other components within normal limits  TROPONIN I (HIGH SENSITIVITY) - Abnormal; Notable for the following components:   Troponin I (High Sensitivity) 213 (*)    All other components within normal limits    EKG EKG Interpretation  Date/Time:  Saturday August 11 2021 16:24:45 EDT Ventricular Rate:  101 PR Interval:  150 QRS Duration: 88 QT Interval:  330 QTC  Calculation: 427 R Axis:   -48 Text Interpretation: Sinus tachycardia Left anterior fascicular block Cannot rule out Inferior infarct (masked by fascicular block?) , age undetermined Abnormal ECG NO prior ECG for comparison. 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 FINDINGS: The  cardiomediastinal contours are within normal limits. The lungs are clear. No pneumothorax or pleural effusion. No acute finding in the visualized skeleton. IMPRESSION: No acute cardiopulmonary finding. Electronically Signed   By: Emmaline Kluver M.D.   On: 08/11/2021 17:20    Procedures .Critical Care Performed by: Marita Kansas, PA-C Authorized by: Marita Kansas, PA-C   Critical care provider statement:    Critical care time (minutes):  40   Critical care was necessary to treat or prevent imminent or life-threatening deterioration of the following conditions:  Circulatory failure   Critical care was time spent personally by me on the following activities:  Ordering and performing treatments and interventions, development of treatment plan with patient or surrogate, ordering and review of radiographic studies, ordering and review of laboratory studies, re-evaluation of patient's condition, obtaining history from patient or surrogate, review of old charts, examination of patient and evaluation of patient's response to treatment   Medications Ordered in ED Medications  aspirin tablet 325 mg (has no administration in time range)    ED Course  I have reviewed the triage vital signs and the nursing notes.  Pertinent labs & imaging results that were available during my care of the patient were reviewed by me and considered in my medical decision making (see chart for details).    MDM Rules/Calculators/A&P                            45 year old male with a past medical history of GERD presents today for evaluation of nonradiating substernal chest pressure associated with diaphoresis and shortness of breath onset around 12:00 that resolved while in triage.  Denies prior personal cardiac history.  26 pack year smoking history. Family history significant for CAD.  CBC significant for mild leukocytosis likely reactive.  BMP unremarkable with exception of glucose of 119.  Troponin 141>213.   Discussed with cardiology who will evaluate and admit patient. Patient started on heparin gtt. ASA 325 given.    Final Clinical Impression(s) / ED Diagnoses Final diagnoses:  None    Rx / DC Orders ED Discharge Orders     None        Marita Kansas, PA-C 08/11/21 2240    Marita Kansas, PA-C 08/11/21 2240    Tegeler, Canary Brim, MD 08/12/21 0008

## 2021-08-11 NOTE — ED Notes (Signed)
Pt called and did not respond.  Called on phone and stated he stepped out of the waiting room.  He is returning.

## 2021-08-11 NOTE — Progress Notes (Signed)
ANTICOAGULATION CONSULT NOTE - Initial Consult  Pharmacy Consult for Heparin Indication: chest pain/ACS  Allergies  Allergen Reactions   Azithromycin Other (See Comments)    Caused his tongue to breakout with bumps    Patient Measurements: Height: 5' 9.5" (176.5 cm) Weight: 102.1 kg (225 lb) IBW/kg (Calculated) : 71.85 Heparin Dosing Weight: 94 kg  Vital Signs: Temp: 98.1 F (36.7 C) (10/15 2132) Temp Source: Oral (10/15 2132) BP: 141/95 (10/15 2200) Pulse Rate: 85 (10/15 2200)  Labs: Recent Labs    08/11/21 1630 08/11/21 1851  HGB 16.7  --   HCT 49.1  --   PLT 232  --   CREATININE 0.97  --   TROPONINIHS 141* 213*    Estimated Creatinine Clearance: 116.7 mL/min (by C-G formula based on SCr of 0.97 mg/dL).   Medical History: Past Medical History:  Diagnosis Date   Acid reflux    Kidney stone     Medications:  See electronic med rec  Assessment: 45 y.o. M presents with CP. To begin heparin for ACS. CBC ok on admission. No AC PTA.  Goal of Therapy:  Heparin level 0.3-0.7 units/ml Monitor platelets by anticoagulation protocol: Yes   Plan:  Heparin IV bolus 4000 units Heparin gtt at 1350 units/hr Will f/u heparin level in hours Daily heparin level and CBC  Christoper Fabian, PharmD, BCPS Please see amion for complete clinical pharmacist phone list 08/11/2021,10:11 PM

## 2021-08-11 NOTE — ED Provider Notes (Signed)
Emergency Medicine Provider Triage Evaluation Note  Kevin Lloyd , a 45 y.o. male  was evaluated in triage.  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 Also complains of SOB and diaphoresis. No nausea or vomiting. Current everyday smoker. Positive Fhx of CAD. EMS arrived on scene and pt's BP was in the 220's. Does not take anything for HTN. Currently pain about a 4/10. No other complaints at this time.  Review of Systems  Positive: + chest pain, diaphoresis, SOB Negative: - nausea, vomiting, leg swelling  Physical Exam  BP (!) 145/100 (BP Location: Left Arm)   Pulse (!) 112   Temp 99.3 F (37.4 C) (Oral)   Resp 16   SpO2 99%  Gen:   Awake, no distress   Resp:  Normal effort  MSK:   Moves extremities without difficulty  Other:  RRR  Medical Decision Making  Medically screening exam initiated at 4:27 PM.  Appropriate orders placed.  Kevin Lloyd was informed that the remainder of the evaluation will be completed by another provider, this initial triage assessment does not replace that evaluation, and the importance of remaining in the ED until their evaluation is complete.     Tanda Rockers, PA-C 08/11/21 1627    Benjiman Core, MD 08/11/21 [phone]

## 2021-08-11 NOTE — ED Triage Notes (Signed)
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

## 2021-08-12 ENCOUNTER — Encounter (HOSPITAL_COMMUNITY): Payer: Self-pay | Admitting: Cardiology

## 2021-08-12 DIAGNOSIS — K219 Gastro-esophageal reflux disease without esophagitis: Secondary | ICD-10-CM

## 2021-08-12 DIAGNOSIS — Z720 Tobacco use: Secondary | ICD-10-CM

## 2021-08-12 DIAGNOSIS — I214 Non-ST elevation (NSTEMI) myocardial infarction: Principal | ICD-10-CM

## 2021-08-12 DIAGNOSIS — N529 Male erectile dysfunction, unspecified: Secondary | ICD-10-CM | POA: Insufficient documentation

## 2021-08-12 LAB — HEPARIN LEVEL (UNFRACTIONATED)
Heparin Unfractionated: <0.1 IU/mL — ABNORMAL LOW (ref 0.30–0.70)
Heparin Unfractionated: 0.36 IU/mL (ref 0.30–0.70)

## 2021-08-12 LAB — BASIC METABOLIC PANEL
Anion gap: 9 (ref 5–15)
BUN: 18 mg/dL (ref 6–20)
CO2: 26 mmol/L (ref 22–32)
Calcium: 9.1 mg/dL (ref 8.9–10.3)
Chloride: 103 mmol/L (ref 98–111)
Creatinine, Ser: 0.95 mg/dL (ref 0.61–1.24)
GFR, Estimated: >60 mL/min (ref >60)
Glucose, Bld: 196 mg/dL — ABNORMAL HIGH (ref 70–99)
Potassium: 3.9 mmol/L (ref 3.5–5.1)
Sodium: 138 mmol/L (ref 135–145)

## 2021-08-12 LAB — CBC
HCT: 44.9 % (ref 39.0–52.0)
Hemoglobin: 15.3 g/dL (ref 13.0–17.0)
MCH: 30.4 pg (ref 26.0–34.0)
MCHC: 34.1 g/dL (ref 30.0–36.0)
MCV: 89.3 fL (ref 80.0–100.0)
Platelets: 202 10*3/uL (ref 150–400)
RBC: 5.03 MIL/uL (ref 4.22–5.81)
RDW: 12.3 % (ref 11.5–15.5)
WBC: 11.2 10*3/uL — ABNORMAL HIGH (ref 4.0–10.5)
nRBC: 0 % (ref 0.0–0.2)

## 2021-08-12 LAB — LIPID PANEL
Cholesterol: 206 mg/dL — ABNORMAL HIGH (ref 0–200)
HDL: 35 mg/dL — ABNORMAL LOW (ref >40)
LDL Cholesterol: 128 mg/dL — ABNORMAL HIGH (ref 0–99)
Total CHOL/HDL Ratio: 5.9 RATIO
Triglycerides: 215 mg/dL — ABNORMAL HIGH (ref <150)
VLDL: 43 mg/dL — ABNORMAL HIGH (ref 0–40)

## 2021-08-12 LAB — RESP PANEL BY RT-PCR (FLU A&B, COVID) ARPGX2
Influenza A by PCR: NEGATIVE
Influenza B by PCR: NEGATIVE
SARS Coronavirus 2 by RT PCR: NEGATIVE

## 2021-08-12 LAB — HIV ANTIBODY (ROUTINE TESTING W REFLEX): HIV Screen 4th Generation wRfx: NONREACTIVE

## 2021-08-12 LAB — TROPONIN I (HIGH SENSITIVITY)
Troponin I (High Sensitivity): 153 ng/L (ref <18)
Troponin I (High Sensitivity): 141 ng/L (ref <18)

## 2021-08-12 MED ORDER — SODIUM CHLORIDE 0.9% FLUSH
3.0000 mL | Freq: Two times a day (BID) | INTRAVENOUS | Status: DC
  Administered 2021-08-12 – 2021-08-13 (×3): 3 mL via INTRAVENOUS

## 2021-08-12 MED ORDER — SODIUM CHLORIDE 0.9% FLUSH
3.0000 mL | INTRAVENOUS | Status: DC | PRN
Start: 2021-08-12 — End: 2021-08-13

## 2021-08-12 MED ORDER — PANTOPRAZOLE SODIUM 40 MG PO TBEC
40.0000 mg | DELAYED_RELEASE_TABLET | Freq: Every day | ORAL | Status: DC
  Administered 2021-08-12 – 2021-08-14 (×3): 40 mg via ORAL
  Filled 2021-08-12 (×3): qty 1

## 2021-08-12 MED ORDER — SODIUM CHLORIDE 0.9 % WEIGHT BASED INFUSION: 1.0000 mL/kg/h | INTRAVENOUS | Status: DC

## 2021-08-12 MED ORDER — SODIUM CHLORIDE 0.9 % WEIGHT BASED INFUSION
3.0000 mL/kg/h | INTRAVENOUS | Status: DC
  Administered 2021-08-13: 3 mL/kg/h via INTRAVENOUS

## 2021-08-12 MED ORDER — SODIUM CHLORIDE 0.9 % IV SOLN
250.0000 mL | INTRAVENOUS | Status: DC | PRN
Start: 2021-08-12 — End: 2021-08-13

## 2021-08-12 MED ORDER — ASPIRIN 81 MG PO CHEW
81.0000 mg | CHEWABLE_TABLET | ORAL | Status: AC
  Administered 2021-08-13: 81 mg via ORAL
  Filled 2021-08-12: qty 1

## 2021-08-12 MED ORDER — HEPARIN BOLUS VIA INFUSION
2000.0000 [IU] | Freq: Once | INTRAVENOUS | Status: AC
  Administered 2021-08-12: 2000 [IU] via INTRAVENOUS
  Filled 2021-08-12: qty 2000

## 2021-08-12 MED ORDER — ASPIRIN EC 81 MG PO TBEC
81.0000 mg | DELAYED_RELEASE_TABLET | Freq: Every day | ORAL | Status: DC
  Administered 2021-08-14: 81 mg via ORAL
  Filled 2021-08-12: qty 1

## 2021-08-12 NOTE — Progress Notes (Signed)
ANTICOAGULATION CONSULT NOTE   Pharmacy Consult for Heparin Indication: chest pain/ACS  Allergies  Allergen Reactions   Azithromycin Other (See Comments)    Caused his tongue to breakout with bumps    Patient Measurements: Height: 5\' 9"  (175.3 cm) Weight: 102.2 kg (225 lb 5 oz) IBW/kg (Calculated) : 70.7 Heparin Dosing Weight: 94 kg  Vital Signs: Temp: 98.2 F (36.8 C) (10/16 2152) Temp Source: Oral (10/16 2152) BP: 123/83 (10/16 2152) Pulse Rate: 78 (10/16 2152)  Labs: Recent Labs    08/11/21 1630 08/11/21 1851 08/12/21 0112 08/12/21 0300 08/12/21 1216 08/12/21 2209  HGB 16.7  --   --  15.3  --   --   HCT 49.1  --   --  44.9  --   --   PLT 232  --   --  202  --   --   HEPARINUNFRC  --   --   --   --  <0.10* 0.36  CREATININE 0.97  --   --  0.95  --   --   TROPONINIHS 141* 213* 153* 141*  --   --      Estimated Creatinine Clearance: 118.1 mL/min (by C-G formula based on SCr of 0.95 mg/dL).   Medical History: Past Medical History:  Diagnosis Date   Acid reflux    Chest pain 08/11/2021   Kidney stone    NSTEMI (non-ST elevated myocardial infarction) (HCC) 08/11/2021    Assessment: 45 y.o. M presents with CP. To begin heparin for ACS. CBC ok on admission. No AC PTA.  Heparin level therapeutic (0.36) on gtt at 1600 units/hr. No bleeding noted. Plans for cath tomorrow 10/17  Goal of Therapy:  Heparin level 0.3-0.7 units/ml Monitor platelets by anticoagulation protocol: Yes   Plan:  Continue heparin 1600 units/hr F/u daily heparin level  11/17, PharmD, BCPS Please see amion for complete clinical pharmacist phone list 08/12/2021 11:03 PM

## 2021-08-12 NOTE — Progress Notes (Signed)
ANTICOAGULATION CONSULT NOTE   Pharmacy Consult for Heparin Indication: chest pain/ACS  Allergies  Allergen Reactions   Azithromycin Other (See Comments)    Caused his tongue to breakout with bumps    Patient Measurements: Height: 5' 9.5" (176.5 cm) Weight: 102.1 kg (225 lb) IBW/kg (Calculated) : 71.85 Heparin Dosing Weight: 94 kg  Vital Signs: BP: 114/79 (10/16 1200) Pulse Rate: 69 (10/16 1200)  Labs: Recent Labs    08/11/21 1630 08/11/21 1851 08/12/21 0112 08/12/21 0300 08/12/21 1216  HGB 16.7  --   --  15.3  --   HCT 49.1  --   --  44.9  --   PLT 232  --   --  202  --   HEPARINUNFRC  --   --   --   --  <0.10*  CREATININE 0.97  --   --  0.95  --   TROPONINIHS 141* 213* 153* 141*  --      Estimated Creatinine Clearance: 119.1 mL/min (by C-G formula based on SCr of 0.95 mg/dL).   Medical History: Past Medical History:  Diagnosis Date   Acid reflux    Chest pain 08/11/2021   Kidney stone    NSTEMI (non-ST elevated myocardial infarction) (HCC) 08/11/2021    Medications:  See electronic med rec  Assessment: 45 y.o. M presents with CP. To begin heparin for ACS. CBC ok on admission. No AC PTA.  Initial heparin level undetectable, on 1350 units/hr, no infusion issues Plans for cath tomorrow 10/17  Goal of Therapy:  Heparin level 0.3-0.7 units/ml Monitor platelets by anticoagulation protocol: Yes   Plan:  Heparin 2000 units IV x 1, and gtt increase to 1600 units/hr F/u 6 hour heparin level F/u cath tomorrow  Daylene Posey, PharmD Clinical Pharmacist ED Pharmacist Phone # [phone] 08/12/2021 12:58 PM

## 2021-08-12 NOTE — Progress Notes (Signed)
Cardiology Progress Note  Patient ID: Tate Zagal MRN: [id number] DOB: 07/03/1976 Date of Encounter: 08/12/2021  Primary Cardiologist: None  Subjective   Chief Complaint: None.   HPI: CP free. No symptoms currently. Questions answered regarding NSTEMI.   ROS:  All other ROS reviewed and negative. Pertinent positives noted in the HPI.     Inpatient Medications  Scheduled Meds:  aspirin EC  81 mg Oral Daily   atorvastatin  80 mg Oral Daily   metoprolol tartrate  25 mg Oral BID   nicotine  21 mg Transdermal Once   tamsulosin  0.4 mg Oral Daily   Continuous Infusions:  heparin 1,350 Units/hr (08/11/21 2229)   PRN Meds: acetaminophen, nitroGLYCERIN, ondansetron (ZOFRAN) IV   Vital Signs   Vitals:   08/12/21 0415 08/12/21 0500 08/12/21 0600 08/12/21 0700  BP: 128/76 122/87 (!) 123/92 130/86  Pulse: 71 72 80 78  Resp: (!) 24 16 17  (!) 24  Temp:      TempSrc:      SpO2: 94% 97% 96% 98%  Weight:      Height:       No intake or output data in the 24 hours ending 08/12/21 0947 Last 3 Weights 08/11/2021 06/09/2021 04/27/2021  Weight (lbs) 225 lb 225 lb 220 lb  Weight (kg) 102.059 kg 102.059 kg 99.791 kg      Telemetry  Overnight telemetry shows SR 80s, which I personally reviewed.   ECG  The most recent ECG shows SR, LAD, no acute ischemic changes , which I personally reviewed.   Physical Exam   Vitals:   08/12/21 0415 08/12/21 0500 08/12/21 0600 08/12/21 0700  BP: 128/76 122/87 (!) 123/92 130/86  Pulse: 71 72 80 78  Resp: (!) 24 16 17  (!) 24  Temp:      TempSrc:      SpO2: 94% 97% 96% 98%  Weight:      Height:       No intake or output data in the 24 hours ending 08/12/21 0947  Last 3 Weights 08/11/2021 06/09/2021 04/27/2021  Weight (lbs) 225 lb 225 lb 220 lb  Weight (kg) 102.059 kg 102.059 kg 99.791 kg    Body mass index is 32.75 kg/m.  General: Well nourished, well developed, in no acute distress Head: Atraumatic, normal size  Eyes: PEERLA, EOMI  Neck:  Supple, no JVD Endocrine: No thryomegaly Cardiac: Normal S1, S2; RRR; no murmurs, rubs, or gallops Lungs: Clear to auscultation bilaterally, no wheezing, rhonchi or rales  Abd: Soft, nontender, no hepatomegaly  Ext: No edema, pulses 2+ Musculoskeletal: No deformities, BUE and BLE strength normal and equal Skin: Warm and dry, no rashes   Neuro: Alert and oriented to person, place, time, and situation, CNII-XII grossly intact, no focal deficits  Psych: Normal mood and affect   Labs  High Sensitivity Troponin:   Recent Labs  Lab 08/11/21 1630 08/11/21 1851 08/12/21 0112 08/12/21 0300  TROPONINIHS 141* 213* 153* 141*     Cardiac EnzymesNo results for input(s): TROPONINI in the last 168 hours. No results for input(s): TROPIPOC in the last 168 hours.  Chemistry Recent Labs  Lab 08/11/21 1630 08/12/21 0300  NA 139 138  K 4.4 3.9  CL 105 103  CO2 25 26  GLUCOSE 119* 196*  BUN 15 18  CREATININE 0.97 0.95  CALCIUM 9.9 9.1  GFRNONAA >60 >60  ANIONGAP 9 9    Hematology Recent Labs  Lab 08/11/21 1630 08/12/21 0300  WBC 11.9* 11.2*  RBC 5.49 5.03  HGB 16.7 15.3  HCT 49.1 44.9  MCV 89.4 89.3  MCH 30.4 30.4  MCHC 34.0 34.1  RDW 12.2 12.3  PLT 232 202   BNPNo results for input(s): BNP, PROBNP in the last 168 hours.  DDimer No results for input(s): DDIMER in the last 168 hours.   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 FINDINGS: The cardiomediastinal contours are within normal limits. The lungs are clear. No pneumothorax or pleural effusion. No acute finding in the visualized skeleton. IMPRESSION: No acute cardiopulmonary finding. Electronically Signed   By: Emmaline Kluver M.D.   On: 08/11/2021 17:20    Cardiac Studies  Echo pending  Patient Profile  Dreshon Proffit is a 45 y.o. male with tobacco abuse, acid reflux, erectile  dysfunction who was admitted on 08/11/2021 for chest pain and non-STEMI.  Assessment & Plan   #NSTEMI -Admitted with exertional chest pain symptoms.  Have resolved.  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  CVD risk factors include 24 pack years of tobacco abuse.  EKG without any acute ischemic changes.  No evidence of ST elevation.  He is currently resting comfortably. -Continue aspirin 81 mg daily.  He is on heparin drip.  Risk and benefits of left heart catheterization discussed.  He is willing to proceed.  N.p.o. at midnight. -Continue high intensity statin.  We started him on a beta-blocker.  Echocardiogram is pending. -We will check an A1c and TSH.  LDL 128. -Sublingual nitro as needed for pain.  He is currently pain-free.  Shared Decision Making/Informed Consent The risks [stroke (1 in 1000), death (1 in 1000), kidney failure [usually temporary] (1 in 500), bleeding (1 in 200), allergic reaction [possibly serious] (1 in 200)], benefits (diagnostic support and management of coronary artery disease) and alternatives of a cardiac catheterization were discussed in detail with Mr. Birdsall and he is willing to proceed.  #Tobacco abuse -24 pack years.  Smoking cessation advised.  #GERD -We will give him Protonix in the hospital.  #Erectile dysfunction -Takes Cialis daily.  Informed him we will have to stop this as he may have chest pain requiring nitroglycerin.  This can be revisited after his heart catheterization tomorrow.  FEN -pre cath IVF -dvt ppx: heparin drip -code: full -diet: heart healthy, npo at midnight  For questions or updates, please contact [facility] Please consult [email or web address] for contact info under    Time Spent with Patient: I have spent a total of 35 minutes with patient reviewing hospital notes, telemetry, EKGs, labs and examining the patient as well as establishing an assessment and plan that was discussed with the patient.  > 50% of time was spent in direct patient  care.    Signed, Lenna Gilford. Flora Lipps, MD, [facility]  [facility]  08/12/2021 9:47 AM

## 2021-08-12 NOTE — ED Notes (Signed)
Pt alert, NAD, calm, interactive resps e/u, speaking in complete sentences. VSS/WDL. Denies pain, sob, nausea, or other sx.

## 2021-08-12 NOTE — ED Notes (Signed)
ED TO INPATIENT HANDOFF REPORT  ED Nurse Name and Phone #: Neldon Mc [phone]  S Name/Age/Gender Kevin Lloyd 45 y.o. male Room/Bed: 003C/003C  Code Status   Code Status: Full Code  Home/SNF/Other Home Patient oriented to: self, place, time, and situation Is this baseline? 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     Allergies Allergies  Allergen Reactions   Azithromycin Other (See Comments)    Caused his tongue to breakout with bumps    Level of Care/Admitting Diagnosis ED Disposition     ED Disposition  Admit   Condition  --   Comment  Hospital Area: [facility] [[id number]]  Level of Care: Telemetry Cardiac [[id number]]  May admit patient to [facility] or [facility] if equivalent level of care is available:: Yes  Covid Evaluation: Asymptomatic Screening Protocol (No Symptoms)  Diagnosis: NSTEMI (non-ST elevated myocardial infarction) [facility]) [[id number]]  Admitting Physician: Elmon Kirschner [[id number]]  Attending Physician: Riley Lam A [[id number]]  Estimated length of stay: 3 - 4 days  Certification:: I certify this patient will need inpatient services for at least 2 midnights          B Medical/Surgery History Past Medical History:  Diagnosis Date   Acid reflux    Chest pain 08/11/2021   Kidney stone    NSTEMI (non-ST elevated myocardial infarction) (HCC) 08/11/2021   Past Surgical History:  Procedure Laterality Date   WISDOM TOOTH EXTRACTION       A IV Location/Drains/Wounds Patient Lines/Drains/Airways Status     Active Line/Drains/Airways     Name Placement date Placement time Site Days   Peripheral IV 08/11/21 20 G Right Antecubital 08/11/21  2223  Antecubital  1            Intake/Output Last 24 hours No intake or output data in the 24  hours ending 08/12/21 2109  Labs/Imaging Results for orders placed or performed during the hospital encounter of 08/11/21 (from the past 48 hour(s))  Basic metabolic panel     Status: Abnormal   Collection Time: 08/11/21  4:30 PM  Result Value Ref Range   Sodium 139 135 - 145 mmol/L   Potassium 4.4 3.5 - 5.1 mmol/L   Chloride 105 98 - 111 mmol/L   CO2 25 22 - 32 mmol/L   Glucose, Bld 119 (H) 70 - 99 mg/dL    Comment: Glucose reference range applies only to samples taken after fasting for at least 8 hours.   BUN 15 6 - 20 mg/dL   Creatinine, Ser 4.97 0.61 - 1.24 mg/dL   Calcium 9.9 8.9 - 02.6 mg/dL   GFR, Estimated >37 >85 mL/min    Comment: (NOTE) Calculated using the CKD-EPI Creatinine Equation (2021)    Anion gap 9 5 - 15    Comment: Performed at [facility], [address] [place], [place] [address]  CBC with Differential     Status: Abnormal   Collection Time: 08/11/21  4:30 PM  Result Value Ref Range   WBC 11.9 (H) 4.0 - 10.5 K/uL   RBC 5.49 4.22 - 5.81 MIL/uL   Hemoglobin 16.7 13.0 - 17.0 g/dL   HCT 77.4 12.8 - 78.6 %   MCV 89.4 80.0 - 100.0 fL   MCH 30.4 26.0 - 34.0 pg   MCHC 34.0 30.0 -  36.0 g/dL   RDW 16.1 09.6 - 04.5 %   Platelets 232 150 - 400 K/uL   nRBC 0.0 0.0 - 0.2 %   Neutrophils Relative % 57 %   Neutro Abs 6.8 1.7 - 7.7 K/uL   Lymphocytes Relative 30 %   Lymphs Abs 3.6 0.7 - 4.0 K/uL   Monocytes Relative 8 %   Monocytes Absolute 1.0 0.1 - 1.0 K/uL   Eosinophils Relative 4 %   Eosinophils Absolute 0.5 0.0 - 0.5 K/uL   Basophils Relative 1 %   Basophils Absolute 0.1 0.0 - 0.1 K/uL   Immature Granulocytes 0 %   Abs Immature Granulocytes 0.03 0.00 - 0.07 K/uL    Comment: Performed at [facility], [address], [place], [place] [address]  Troponin I (High Sensitivity)     Status: Abnormal   Collection Time: 08/11/21  4:30 PM  Result Value Ref Range   Troponin I (High Sensitivity) 141 (HH) <18 ng/L    Comment: CRITICAL RESULT CALLED TO,  READ BACK BY AND VERIFIED WITH: B OLDLAND RN BY SSTEPHENS 1951 [id number] (NOTE) Elevated high sensitivity troponin I (hsTnI) values and significant  changes across serial measurements may suggest ACS but many other  chronic and acute conditions are known to elevate hsTnI results.  Refer to the Links section for chest pain algorithms and additional  guidance. Performed at [facility], [address] [place], [place] [address]   Troponin I (High Sensitivity)     Status: Abnormal   Collection Time: 08/11/21  6:51 PM  Result Value Ref Range   Troponin I (High Sensitivity) 213 (HH) <18 ng/L    Comment: CRITICAL VALUE NOTED.  VALUE IS CONSISTENT WITH PREVIOUSLY REPORTED AND CALLED VALUE. (NOTE) Elevated high sensitivity troponin I (hsTnI) values and significant  changes across serial measurements may suggest ACS but many other  chronic and acute conditions are known to elevate hsTnI results.  Refer to the Links section for chest pain algorithms and additional  guidance. Performed at [facility], [address], [place], [place] [address]   HIV Antibody (routine testing w rflx)     Status: None   Collection Time: 08/12/21 12:24 AM  Result Value Ref Range   HIV Screen 4th Generation wRfx Non Reactive Non Reactive    Comment: Performed at [facility], [address] [place], [place] [address]  Troponin I (High Sensitivity)     Status: Abnormal   Collection Time: 08/12/21  1:12 AM  Result Value Ref Range   Troponin I (High Sensitivity) 153 (HH) <18 ng/L    Comment: CRITICAL VALUE NOTED.  VALUE IS CONSISTENT WITH PREVIOUSLY REPORTED AND CALLED VALUE. (NOTE) Elevated high sensitivity troponin I (hsTnI) values and significant  changes across serial measurements may suggest ACS but many other  chronic and acute conditions are known to elevate hsTnI results.  Refer to the Links section for chest pain algorithms and additional  guidance. Performed at [facility], [address] [place], [place] [address]   Resp Panel by RT-PCR (Flu A&B, Covid) Nasopharyngeal Swab     Status: None   Collection Time: 08/12/21  1:12 AM   Specimen: Nasopharyngeal Swab; Nasopharyngeal(NP) swabs in vial transport medium  Result Value Ref Range   SARS Coronavirus 2 by RT PCR NEGATIVE NEGATIVE    Comment: (NOTE) SARS-CoV-2 target nucleic acids are NOT DETECTED.  The SARS-CoV-2 RNA is generally detectable in upper respiratory specimens  during the acute phase of infection. The lowest concentration of SARS-CoV-2 viral copies this assay can detect is 138 copies/mL. A negative result does not preclude SARS-Cov-2 infection and should not be used as the sole basis for treatment or other patient management decisions. A negative result may occur with  improper specimen collection/handling, submission of specimen other than nasopharyngeal swab, presence of viral mutation(s) within the areas targeted by this assay, and inadequate number of viral copies(<138 copies/mL). A negative result must be combined with clinical observations, patient history, and epidemiological information. The expected result is Negative.  Fact Sheet for Patients:  BloggerCourse.com  Fact Sheet for Healthcare Providers:  SeriousBroker.it  This test is no t yet approved or cleared by the [place] FDA and  has been authorized for detection and/or diagnosis of SARS-CoV-2 by FDA under an Emergency Use Authorization (EUA). This EUA will remain  in effect (meaning this test can be used) for the duration of the COVID-19 declaration under Section 564(b)(1) of the Act, 21 U.S.C.section 360bbb-3(b)(1), unless the authorization is terminated  or revoked sooner.       Influenza A by PCR NEGATIVE NEGATIVE   Influenza B by PCR NEGATIVE NEGATIVE    Comment: (NOTE) The Xpert Xpress SARS-CoV-2/FLU/RSV plus assay is intended as an aid in the diagnosis of  influenza from Nasopharyngeal swab specimens and should not be used as a sole basis for treatment. Nasal washings and aspirates are unacceptable for Xpert Xpress SARS-CoV-2/FLU/RSV testing.  Fact Sheet for Patients: BloggerCourse.com  Fact Sheet for Healthcare Providers: SeriousBroker.it  This test is not yet approved or cleared by the [place] FDA and has been authorized for detection and/or diagnosis of SARS-CoV-2 by FDA under an Emergency Use Authorization (EUA). This EUA will remain in effect (meaning this test can be used) for the duration of the COVID-19 declaration under Section 564(b)(1) of the Act, 21 U.S.C. section 360bbb-3(b)(1), unless the authorization is terminated or revoked.  Performed at [facility], [address], [place], [place] [address]   CBC     Status: Abnormal   Collection Time: 08/12/21  3:00 AM  Result Value Ref Range   WBC 11.2 (H) 4.0 - 10.5 K/uL   RBC 5.03 4.22 - 5.81 MIL/uL   Hemoglobin 15.3 13.0 - 17.0 g/dL   HCT 28.4 13.2 - 44.0 %   MCV 89.3 80.0 - 100.0 fL   MCH 30.4 26.0 - 34.0 pg   MCHC 34.1 30.0 - 36.0 g/dL   RDW 10.2 72.5 - 36.6 %   Platelets 202 150 - 400 K/uL   nRBC 0.0 0.0 - 0.2 %    Comment: Performed at [facility], [address] [place], [place] [address]  Basic metabolic panel     Status: Abnormal   Collection Time: 08/12/21  3:00 AM  Result Value Ref Range   Sodium 138 135 - 145 mmol/L   Potassium 3.9 3.5 - 5.1 mmol/L   Chloride 103 98 - 111 mmol/L   CO2 26 22 - 32 mmol/L   Glucose, Bld 196 (H) 70 - 99 mg/dL    Comment: Glucose reference range applies only to samples taken after fasting for at least 8 hours.   BUN 18 6 - 20 mg/dL   Creatinine, Ser 7.42 0.61 - 1.24 mg/dL   Calcium 9.1 8.9 - 59.5 mg/dL   GFR, Estimated >63 >87 mL/min    Comment: (NOTE) Calculated using the CKD-EPI Creatinine Equation (2021)    Anion  gap 9 5 - 15    Comment: Performed  at [facility], [address], [place], [place] [address]  Lipid panel     Status: Abnormal   Collection Time: 08/12/21  3:00 AM  Result Value Ref Range   Cholesterol 206 (H) 0 - 200 mg/dL   Triglycerides 604 (H) <150 mg/dL   HDL 35 (L) >54 mg/dL   Total CHOL/HDL Ratio 5.9 RATIO   VLDL 43 (H) 0 - 40 mg/dL   LDL Cholesterol 098 (H) 0 - 99 mg/dL    Comment:        Total Cholesterol/HDL:CHD Risk Coronary Heart Disease Risk Table                     Men   Women  1/2 Average Risk   3.4   3.3  Average Risk       5.0   4.4  2 X Average Risk   9.6   7.1  3 X Average Risk  23.4   11.0        Use the calculated Patient Ratio above and the CHD Risk Table to determine the patient's CHD Risk.        ATP III CLASSIFICATION (LDL):  <100     mg/dL   Optimal  119-147  mg/dL   Near or Above                    Optimal  130-159  mg/dL   Borderline  829-562  mg/dL   High  >130     mg/dL   Very High Performed at [facility], [address] [place], [place] [address]   Troponin I (High Sensitivity)     Status: Abnormal   Collection Time: 08/12/21  3:00 AM  Result Value Ref Range   Troponin I (High Sensitivity) 141 (HH) <18 ng/L    Comment: CRITICAL VALUE NOTED.  VALUE IS CONSISTENT WITH PREVIOUSLY REPORTED AND CALLED VALUE. (NOTE) Elevated high sensitivity troponin I (hsTnI) values and significant  changes across serial measurements may suggest ACS but many other  chronic and acute conditions are known to elevate hsTnI results.  Refer to the Links section for chest pain algorithms and additional  guidance. Performed at [facility], [address] [place], [place] [address]   Heparin level (unfractionated)     Status: Abnormal   Collection Time: 08/12/21 12:16 PM  Result Value Ref Range   Heparin Unfractionated <0.10 (L) 0.30 - 0.70 IU/mL    Comment: (NOTE) The clinical reportable range upper limit is being lowered to >1.10 to align with the FDA approved guidance  for the current laboratory assay.  If heparin results are below expected values, and patient dosage has  been confirmed, suggest follow up testing of antithrombin III levels. Performed at [facility], 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 FINDINGS: The cardiomediastinal contours are within normal limits. The lungs are clear. No pneumothorax or pleural effusion. No acute finding in the visualized skeleton. IMPRESSION: No acute cardiopulmonary finding. Electronically Signed   By: Emmaline Kluver M.D.   On: 08/11/2021 17:20    Pending Labs [employer] (From admission, onward)     Start  Ordered   08/13/21 0500  Heparin level (unfractionated)  Daily,   R      08/11/21 2213   08/13/21 0500  Hemoglobin A1c  Tomorrow morning,   R        08/12/21 0924   08/13/21 0500  TSH  Tomorrow morning,   R        08/12/21 0924   08/12/21 2000  Heparin level (unfractionated)  Once-Timed,   TIMED        08/12/21 1308   08/12/21 0500  CBC  Daily,   R      08/11/21 2213            Vitals/Pain Today's Vitals   08/12/21 1930 08/12/21 2000 08/12/21 2030 08/12/21 2057  BP: 126/82 139/79 113/72   Pulse: 90 88 88   Resp: (!) 21 16 (!) 23   Temp:    98.5 F (36.9 C)  TempSrc:    Oral  SpO2: 96% 93% 91%   Weight:      Height:      PainSc:        Isolation Precautions No active isolations  Medications Medications  nicotine (NICODERM CQ - dosed in mg/24 hours) patch 21 mg (21 mg Transdermal Patch Applied 08/11/21 2220)  heparin ADULT infusion 100 units/mL (25000 units/230mL) (1,600 Units/hr Intravenous New Bag/Given 08/12/21 1451)  tamsulosin (FLOMAX) capsule 0.4 mg (0.4 mg Oral Given 08/12/21 1031)  aspirin EC tablet 81 mg (81 mg Oral Given 08/12/21 1033)  nitroGLYCERIN (NITROSTAT) SL tablet 0.4  mg (has no administration in time range)  acetaminophen (TYLENOL) tablet 650 mg (has no administration in time range)  ondansetron (ZOFRAN) injection 4 mg (has no administration in time range)  metoprolol tartrate (LOPRESSOR) tablet 25 mg (25 mg Oral Not Given 08/12/21 1025)  atorvastatin (LIPITOR) tablet 80 mg (80 mg Oral Given 08/12/21 1032)  pantoprazole (PROTONIX) EC tablet 40 mg (40 mg Oral Given 08/12/21 1032)  heparin bolus via infusion 4,000 Units (4,000 Units Intravenous Bolus from Bag 08/11/21 2229)  heparin bolus via infusion 2,000 Units (2,000 Units Intravenous Bolus from Bag 08/12/21 1451)    Mobility walks Low fall risk   Focused Assessments    R Recommendations: See Admitting Provider Note  Report given to:  Lavonna Monarch RN  Additional Notes:

## 2021-08-13 ENCOUNTER — Encounter (HOSPITAL_COMMUNITY)
Admission: EM | Disposition: A | Discharge status: Home / Self Care | Payer: Self-pay | Source: Home / Self Care | Attending: Internal Medicine

## 2021-08-13 ENCOUNTER — Inpatient Hospital Stay (HOSPITAL_COMMUNITY): Payer: 59

## 2021-08-13 DIAGNOSIS — I2510 Atherosclerotic heart disease of native coronary artery without angina pectoris: Secondary | ICD-10-CM

## 2021-08-13 DIAGNOSIS — R079 Chest pain, unspecified: Secondary | ICD-10-CM

## 2021-08-13 DIAGNOSIS — R7303 Prediabetes: Secondary | ICD-10-CM

## 2021-08-13 DIAGNOSIS — E782 Mixed hyperlipidemia: Secondary | ICD-10-CM

## 2021-08-13 DIAGNOSIS — Z720 Tobacco use: Secondary | ICD-10-CM

## 2021-08-13 HISTORY — PX: CORONARY STENT INTERVENTION: CATH118234

## 2021-08-13 HISTORY — PX: LEFT HEART CATH AND CORONARY ANGIOGRAPHY: CATH118249

## 2021-08-13 LAB — TSH: TSH: 3.34 u[IU]/mL (ref 0.350–4.500)

## 2021-08-13 LAB — CBC
HCT: 49.6 % (ref 39.0–52.0)
Hemoglobin: 16.6 g/dL (ref 13.0–17.0)
MCH: 30.3 pg (ref 26.0–34.0)
MCHC: 33.5 g/dL (ref 30.0–36.0)
MCV: 90.5 fL (ref 80.0–100.0)
Platelets: 197 10*3/uL (ref 150–400)
RBC: 5.48 MIL/uL (ref 4.22–5.81)
RDW: 12.1 % (ref 11.5–15.5)
WBC: 10.1 10*3/uL (ref 4.0–10.5)
nRBC: 0 % (ref 0.0–0.2)

## 2021-08-13 LAB — ECHOCARDIOGRAM COMPLETE
Area-P 1/2: 3.74 cm2
Calc EF: 55.8 %
Height: 69 in
MV VTI: 3.83 cm2
S' Lateral: 3.1 cm
Single Plane A2C EF: 57.2 %
Single Plane A4C EF: 52.7 %
Weight: 3604.96 oz

## 2021-08-13 LAB — POCT ACTIVATED CLOTTING TIME: Activated Clotting Time: 277 seconds

## 2021-08-13 LAB — HEPARIN LEVEL (UNFRACTIONATED)
Heparin Unfractionated: 0.26 IU/mL — ABNORMAL LOW (ref 0.30–0.70)
Heparin Unfractionated: 0.34 IU/mL (ref 0.30–0.70)

## 2021-08-13 LAB — HEMOGLOBIN A1C
Hgb A1c MFr Bld: 6.2 % — ABNORMAL HIGH (ref 4.8–5.6)
Mean Plasma Glucose: 131.24 mg/dL

## 2021-08-13 SURGERY — LEFT HEART CATH AND CORONARY ANGIOGRAPHY
Anesthesia: LOCAL

## 2021-08-13 MED ORDER — HEPARIN SODIUM (PORCINE) 1000 UNIT/ML IJ SOLN
INTRAMUSCULAR | Status: DC | PRN
  Administered 2021-08-13 (×2): 5000 [IU] via INTRAVENOUS

## 2021-08-13 MED ORDER — LIDOCAINE HCL (PF) 1 % IJ SOLN
INTRAMUSCULAR | Status: DC | PRN
  Administered 2021-08-13 (×2): 2 mL

## 2021-08-13 MED ORDER — IOHEXOL 350 MG/ML SOLN
INTRAVENOUS | Status: DC | PRN
  Administered 2021-08-13: 105 mL

## 2021-08-13 MED ORDER — LABETALOL HCL 5 MG/ML IV SOLN: 10.0000 mg | INTRAVENOUS | Status: AC | PRN

## 2021-08-13 MED ORDER — HEPARIN (PORCINE) IN NACL 1000-0.9 UT/500ML-% IV SOLN
INTRAVENOUS | Status: DC | PRN
  Administered 2021-08-13 (×2): 500 mL

## 2021-08-13 MED ORDER — FENTANYL CITRATE (PF) 100 MCG/2ML IJ SOLN
INTRAMUSCULAR | Status: DC | PRN
  Administered 2021-08-13 (×3): 25 ug via INTRAVENOUS

## 2021-08-13 MED ORDER — SODIUM CHLORIDE 0.9 % IV SOLN: 250.0000 mL | INTRAVENOUS | Status: DC | PRN

## 2021-08-13 MED ORDER — HEPARIN (PORCINE) IN NACL 1000-0.9 UT/500ML-% IV SOLN
INTRAVENOUS | Status: AC
  Filled 2021-08-13: qty 1000

## 2021-08-13 MED ORDER — NICOTINE 21 MG/24HR TD PT24
21.0000 mg | MEDICATED_PATCH | Freq: Every day | TRANSDERMAL | Status: DC
  Administered 2021-08-13 – 2021-08-14 (×2): 21 mg via TRANSDERMAL
  Filled 2021-08-13 (×2): qty 1

## 2021-08-13 MED ORDER — HEPARIN SODIUM (PORCINE) 1000 UNIT/ML IJ SOLN
INTRAMUSCULAR | Status: AC
  Filled 2021-08-13: qty 1

## 2021-08-13 MED ORDER — FENTANYL CITRATE (PF) 100 MCG/2ML IJ SOLN
INTRAMUSCULAR | Status: AC
  Filled 2021-08-13: qty 2

## 2021-08-13 MED ORDER — MIDAZOLAM HCL 2 MG/2ML IJ SOLN
INTRAMUSCULAR | Status: AC
  Filled 2021-08-13: qty 2

## 2021-08-13 MED ORDER — TICAGRELOR 90 MG PO TABS
90.0000 mg | ORAL_TABLET | Freq: Two times a day (BID) | ORAL | Status: DC
  Administered 2021-08-13 – 2021-08-14 (×2): 90 mg via ORAL
  Filled 2021-08-13 (×2): qty 1

## 2021-08-13 MED ORDER — VERAPAMIL HCL 2.5 MG/ML IV SOLN
INTRAVENOUS | Status: DC | PRN
  Administered 2021-08-13: 10 mL via INTRA_ARTERIAL

## 2021-08-13 MED ORDER — HYDRALAZINE HCL 20 MG/ML IJ SOLN: 10.0000 mg | INTRAMUSCULAR | Status: AC | PRN

## 2021-08-13 MED ORDER — LIDOCAINE HCL (PF) 1 % IJ SOLN
INTRAMUSCULAR | Status: AC
  Filled 2021-08-13: qty 30

## 2021-08-13 MED ORDER — SODIUM CHLORIDE 0.9 % IV SOLN: INTRAVENOUS | Status: AC

## 2021-08-13 MED ORDER — VERAPAMIL HCL 2.5 MG/ML IV SOLN
INTRAVENOUS | Status: AC
  Filled 2021-08-13: qty 2

## 2021-08-13 MED ORDER — NITROGLYCERIN 1 MG/10 ML FOR IR/CATH LAB
INTRA_ARTERIAL | Status: DC | PRN
  Administered 2021-08-13 (×2): 150 ug

## 2021-08-13 MED ORDER — SODIUM CHLORIDE 0.9% FLUSH: 3.0000 mL | INTRAVENOUS | Status: DC | PRN

## 2021-08-13 MED ORDER — NITROGLYCERIN 1 MG/10 ML FOR IR/CATH LAB
INTRA_ARTERIAL | Status: AC
  Filled 2021-08-13: qty 10

## 2021-08-13 MED ORDER — SODIUM CHLORIDE 0.9% FLUSH
3.0000 mL | Freq: Two times a day (BID) | INTRAVENOUS | Status: DC
  Administered 2021-08-13 – 2021-08-14 (×2): 3 mL via INTRAVENOUS

## 2021-08-13 MED ORDER — TICAGRELOR 90 MG PO TABS
ORAL_TABLET | ORAL | Status: DC | PRN
  Administered 2021-08-13: 180 mg via ORAL

## 2021-08-13 MED ORDER — MIDAZOLAM HCL 2 MG/2ML IJ SOLN
INTRAMUSCULAR | Status: DC | PRN
  Administered 2021-08-13: 1 mg via INTRAVENOUS
  Administered 2021-08-13: 2 mg via INTRAVENOUS
  Administered 2021-08-13: 1 mg via INTRAVENOUS

## 2021-08-13 SURGICAL SUPPLY — 19 items
BALLN SAPPHIRE 2.0X12 (BALLOONS) ×2
BALLN SAPPHIRE ~~LOC~~ 2.75X10 (BALLOONS) ×2 IMPLANT
BALLOON SAPPHIRE 2.0X12 (BALLOONS) ×1 IMPLANT
CATH 5FR JL3.5 JR4 ANG PIG MP (CATHETERS) ×2 IMPLANT
CATH LAUNCHER 5F EBU3.5 (CATHETERS) ×2 IMPLANT
CATH LAUNCHER 5F RBU3.5 (CATHETERS) IMPLANT
DEVICE RAD COMP TR BAND LRG (VASCULAR PRODUCTS) ×2 IMPLANT
GLIDESHEATH SLEND SS 6F .021 (SHEATH) ×2 IMPLANT
GUIDEWIRE INQWIRE 1.5J.035X260 (WIRE) ×1 IMPLANT
INQWIRE 1.5J .035X260CM (WIRE) ×2
KIT ENCORE 26 ADVANTAGE (KITS) ×2 IMPLANT
KIT HEART LEFT (KITS) ×2 IMPLANT
PACK CARDIAC CATHETERIZATION (CUSTOM PROCEDURE TRAY) ×2 IMPLANT
SHEATH PROBE COVER 6X72 (BAG) ×2 IMPLANT
STENT ONYX FRONTIER 2.5X15 (Permanent Stent) ×2 IMPLANT
SYR MEDRAD MARK 7 150ML (SYRINGE) ×2 IMPLANT
TRANSDUCER W/STOPCOCK (MISCELLANEOUS) ×2 IMPLANT
TUBING CIL FLEX 10 FLL-RA (TUBING) ×2 IMPLANT
WIRE COUGAR XT STRL 190CM (WIRE) ×2 IMPLANT

## 2021-08-13 NOTE — Plan of Care (Signed)
  Problem: Cardiovascular: Goal: Ability to achieve and maintain adequate cardiovascular perfusion will improve Outcome: Progressing   Problem: Health Behavior/Discharge Planning: Goal: Ability to safely manage health-related needs after discharge will improve Outcome: Progressing   Problem: Education: Goal: Understanding of CV disease, CV risk reduction, and recovery process will improve Outcome: Progressing

## 2021-08-13 NOTE — H&P (View-Only) (Signed)
Progress Note  Patient Name: Kevin Lloyd Date of Encounter: 08/13/2021  [facility] Cardiologist: Reatha Harps, MD   Subjective   No chest pain. Planned for cath today. Family at bedside. All questions answered.   Inpatient Medications    Scheduled Meds:  [START ON 08/14/2021] aspirin EC  81 mg Oral Daily   atorvastatin  80 mg Oral Daily   metoprolol tartrate  25 mg Oral BID   pantoprazole  40 mg Oral Daily   sodium chloride flush  3 mL Intravenous Q12H   tamsulosin  0.4 mg Oral Daily   Continuous Infusions:  sodium chloride     sodium chloride 1 mL/kg/hr (08/13/21 0521)   heparin 1,700 Units/hr (08/13/21 0605)   PRN Meds: sodium chloride, acetaminophen, nitroGLYCERIN, ondansetron (ZOFRAN) IV, sodium chloride flush   Vital Signs    Vitals:   08/12/21 2030 08/12/21 2057 08/12/21 2152 08/13/21 0100  BP: 113/72  123/83 128/83  Pulse: 88  78 69  Resp: (!) 23  18 18   Temp:  98.5 F (36.9 C) 98.2 F (36.8 C) 98.7 F (37.1 C)  TempSrc:  Oral Oral Oral  SpO2: 91%  98% 96%  Weight:   102.2 kg   Height:   5\' 9"  (1.753 m)    No intake or output data in the 24 hours ending 08/13/21 0814 Last 3 Weights 08/12/2021 08/11/2021 06/09/2021  Weight (lbs) 225 lb 5 oz 225 lb 225 lb  Weight (kg) 102.2 kg 102.059 kg 102.059 kg      Telemetry    SR - Personally Reviewed  ECG    SR with TWI in lead III - Personally Reviewed  Physical Exam   GEN: No acute distress.   Neck: No JVD Cardiac: RRR, no murmurs, rubs, or gallops.  Respiratory: Clear to auscultation bilaterally. GI: Soft, nontender, non-distended  MS: No edema; No deformity. Neuro:  Nonfocal  Psych: Normal affect   Labs    High Sensitivity Troponin:   Recent Labs  Lab 08/11/21 1630 08/11/21 1851 08/12/21 0112 08/12/21 0300  TROPONINIHS 141* 213* 153* 141*     Chemistry Recent Labs  Lab 08/11/21 1630 08/12/21 0300  NA 139 138  K 4.4 3.9  CL 105 103  CO2 25 26  GLUCOSE 119* 196*  BUN  15 18  CREATININE 0.97 0.95  CALCIUM 9.9 9.1  GFRNONAA >60 >60  ANIONGAP 9 9    Lipids  Recent Labs  Lab 08/12/21 0300  CHOL 206*  TRIG 215*  HDL 35*  LDLCALC 128*  CHOLHDL 5.9    Hematology Recent Labs  Lab 08/11/21 1630 08/12/21 0300 08/13/21 0219  WBC 11.9* 11.2* 10.1  RBC 5.49 5.03 5.48  HGB 16.7 15.3 16.6  HCT 49.1 44.9 49.6  MCV 89.4 89.3 90.5  MCH 30.4 30.4 30.3  MCHC 34.0 34.1 33.5  RDW 12.2 12.3 12.1  PLT 232 202 197   Thyroid  Recent Labs  Lab 08/13/21 0219  TSH 3.340    BNPNo results for input(s): BNP, PROBNP in the last 168 hours.  DDimer No results for input(s): DDIMER in the last 168 hours.   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 FINDINGS: The cardiomediastinal contours are within normal limits. The lungs are clear. No pneumothorax or pleural effusion. No acute finding in the visualized  skeleton. IMPRESSION: No acute cardiopulmonary finding. Electronically Signed   By: Emmaline Kluver M.D.   On: 08/11/2021 17:20    Cardiac Studies   Echo: pending  Patient Profile     45 y.o. male with PMH of tobacco use, GERD who presented with chest pain and NSTEMI.   Assessment & Plan    NSTEMI: hsTn peaked at 213. EKG with TWI in lead III. Remains on IV heparin, ASA, atorvastatin 80mg  daily, metoprolol 25mg  BID. Planned for cardiac cath today.  -- echo pending  HTN: stable with metoprolol 25mg  BID  HLD: LDL 128 -- started on atorvastatin 80mg  daily -- needs FLP/LFTs in 8 weeks  Tobacco use: cessation advised. He is motivated to quit.  -- nicotine patch ordered  PreDM: Hgb A1c 6.2 -- may need SGLT2 prior to discharge   For questions or updates, please contact [facility] Please consult [email or web address] for contact info under    Signed, , NP  08/13/2021, 8:14 AM    I have  examined the patient and reviewed assessment and plan and discussed with patient.  Agree with above as stated.    All questions about cath answered. Plan for cath later today.  IV heparin until cath.   Smoking cessation.  Further plans based on cath.   

## 2021-08-13 NOTE — Plan of Care (Signed)
  Problem: Activity: Goal: Ability to return to baseline activity level will improve Outcome: Progressing   Problem: Clinical Measurements: Goal: Ability to maintain clinical measurements within normal limits will improve Outcome: Progressing Goal: Will remain free from infection Outcome: Progressing Goal: Respiratory complications will improve Outcome: Progressing Goal: Cardiovascular complication will be avoided Outcome: Progressing   Problem: Activity: Goal: Risk for activity intolerance will decrease Outcome: Progressing   Problem: Nutrition: Goal: Adequate nutrition will be maintained Outcome: Progressing   Problem: Elimination: Goal: Will not experience complications related to bowel motility Outcome: Progressing Goal: Will not experience complications related to urinary retention Outcome: Progressing   Problem: Pain Managment: Goal: General experience of comfort will improve Outcome: Progressing   Problem: Safety: Goal: Ability to remain free from injury will improve Outcome: Progressing   Problem: Skin Integrity: Goal: Risk for impaired skin integrity will decrease Outcome: Progressing

## 2021-08-13 NOTE — Progress Notes (Signed)
Pt does not wish to see the cath video. Sanda Linger, RN

## 2021-08-13 NOTE — Progress Notes (Addendum)
Progress Note  Patient Name: Kevin Lloyd Date of Encounter: 08/13/2021  [facility] Cardiologist: Reatha Harps, MD   Subjective   No chest pain. Planned for cath today. Family at bedside. All questions answered.   Inpatient Medications    Scheduled Meds:  [START ON 08/14/2021] aspirin EC  81 mg Oral Daily   atorvastatin  80 mg Oral Daily   metoprolol tartrate  25 mg Oral BID   pantoprazole  40 mg Oral Daily   sodium chloride flush  3 mL Intravenous Q12H   tamsulosin  0.4 mg Oral Daily   Continuous Infusions:  sodium chloride     sodium chloride 1 mL/kg/hr (08/13/21 0521)   heparin 1,700 Units/hr (08/13/21 0605)   PRN Meds: sodium chloride, acetaminophen, nitroGLYCERIN, ondansetron (ZOFRAN) IV, sodium chloride flush   Vital Signs    Vitals:   08/12/21 2030 08/12/21 2057 08/12/21 2152 08/13/21 0100  BP: 113/72  123/83 128/83  Pulse: 88  78 69  Resp: (!) 23  18 18   Temp:  98.5 F (36.9 C) 98.2 F (36.8 C) 98.7 F (37.1 C)  TempSrc:  Oral Oral Oral  SpO2: 91%  98% 96%  Weight:   102.2 kg   Height:   5\' 9"  (1.753 m)    No intake or output data in the 24 hours ending 08/13/21 0814 Last 3 Weights 08/12/2021 08/11/2021 06/09/2021  Weight (lbs) 225 lb 5 oz 225 lb 225 lb  Weight (kg) 102.2 kg 102.059 kg 102.059 kg      Telemetry    SR - Personally Reviewed  ECG    SR with TWI in lead III - Personally Reviewed  Physical Exam   GEN: No acute distress.   Neck: No JVD Cardiac: RRR, no murmurs, rubs, or gallops.  Respiratory: Clear to auscultation bilaterally. GI: Soft, nontender, non-distended  MS: No edema; No deformity. Neuro:  Nonfocal  Psych: Normal affect   Labs    High Sensitivity Troponin:   Recent Labs  Lab 08/11/21 1630 08/11/21 1851 08/12/21 0112 08/12/21 0300  TROPONINIHS 141* 213* 153* 141*     Chemistry Recent Labs  Lab 08/11/21 1630 08/12/21 0300  NA 139 138  K 4.4 3.9  CL 105 103  CO2 25 26  GLUCOSE 119* 196*  BUN  15 18  CREATININE 0.97 0.95  CALCIUM 9.9 9.1  GFRNONAA >60 >60  ANIONGAP 9 9    Lipids  Recent Labs  Lab 08/12/21 0300  CHOL 206*  TRIG 215*  HDL 35*  LDLCALC 128*  CHOLHDL 5.9    Hematology Recent Labs  Lab 08/11/21 1630 08/12/21 0300 08/13/21 0219  WBC 11.9* 11.2* 10.1  RBC 5.49 5.03 5.48  HGB 16.7 15.3 16.6  HCT 49.1 44.9 49.6  MCV 89.4 89.3 90.5  MCH 30.4 30.4 30.3  MCHC 34.0 34.1 33.5  RDW 12.2 12.3 12.1  PLT 232 202 197   Thyroid  Recent Labs  Lab 08/13/21 0219  TSH 3.340    BNPNo results for input(s): BNP, PROBNP in the last 168 hours.  DDimer No results for input(s): DDIMER in the last 168 hours.   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 FINDINGS: The cardiomediastinal contours are within normal limits. The lungs are clear. No pneumothorax or pleural effusion. No acute finding in the visualized  skeleton. IMPRESSION: No acute cardiopulmonary finding. Electronically Signed   By: Emmaline Kluver M.D.   On: 08/11/2021 17:20    Cardiac Studies   Echo: pending  Patient Profile     45 y.o. male with PMH of tobacco use, GERD who presented with chest pain and NSTEMI.   Assessment & Plan    NSTEMI: hsTn peaked at 213. EKG with TWI in lead III. Remains on IV heparin, ASA, atorvastatin 80mg  daily, metoprolol 25mg  BID. Planned for cardiac cath today.  -- echo pending  HTN: stable with metoprolol 25mg  BID  HLD: LDL 128 -- started on atorvastatin 80mg  daily -- needs FLP/LFTs in 8 weeks  Tobacco use: cessation advised. He is motivated to quit.  -- nicotine patch ordered  PreDM: Hgb A1c 6.2 -- may need SGLT2 prior to discharge   For questions or updates, please contact [facility] Please consult [email or web address] for contact info under    Signed, , NP  08/13/2021, 8:14 AM    I have  examined the patient and reviewed assessment and plan and discussed with patient.  Agree with above as stated.    All questions about cath answered. Plan for cath later today.  IV heparin until cath.   Smoking cessation.  Further plans based on cath.   

## 2021-08-13 NOTE — Progress Notes (Signed)
ANTICOAGULATION CONSULT NOTE   Pharmacy Consult for Heparin Indication: chest pain/ACS  Allergies  Allergen Reactions   Azithromycin Other (See Comments)    Caused his tongue to breakout with bumps    Patient Measurements: Height: 5\' 9"  (175.3 cm) Weight: 102.2 kg (225 lb 5 oz) IBW/kg (Calculated) : 70.7 Heparin Dosing Weight: 94 kg  Vital Signs: Temp: 98.7 F (37.1 C) (10/17 0100) Temp Source: Oral (10/17 0100) BP: 127/80 (10/17 1046) Pulse Rate: 66 (10/17 1046)  Labs: Recent Labs    08/11/21 1630 08/11/21 1851 08/12/21 0112 08/12/21 0300 08/12/21 1216 08/12/21 2209 08/13/21 0219 08/13/21 1042  HGB 16.7  --   --  15.3  --   --  16.6  --   HCT 49.1  --   --  44.9  --   --  49.6  --   PLT 232  --   --  202  --   --  197  --   HEPARINUNFRC  --   --   --   --    < > 0.36 0.26* 0.34  CREATININE 0.97  --   --  0.95  --   --   --   --   TROPONINIHS 141* 213* 153* 141*  --   --   --   --    < > = values in this interval not displayed.     Estimated Creatinine Clearance: 118.1 mL/min (by C-G formula based on SCr of 0.95 mg/dL).   Medical History: Past Medical History:  Diagnosis Date   Acid reflux    Chest pain 08/11/2021   Kidney stone    NSTEMI (non-ST elevated myocardial infarction) (HCC) 08/11/2021    Assessment: 45 y.o. M presents with CP and pharmacy dosing heparin for NSTEMI. Plans are noted for cath today.  No AC PTA.  Heparin level therapeutic (0.34) on gtt at 1700 units/hr.   Goal of Therapy:  Heparin level 0.3-0.7 units/ml Monitor platelets by anticoagulation protocol: Yes   Plan:  Continue heparin 1700 units/hr Will follow plans post cath  57, PharmD Clinical Pharmacist **Pharmacist phone directory can now be found on [email or web address] ([email or web address]).  Listed under [facility].

## 2021-08-13 NOTE — Interval H&P Note (Signed)
Cath Lab Visit (complete for each Cath Lab visit)  Clinical Evaluation Leading to the Procedure:   ACS: Yes.    Non-ACS:    Anginal Classification: CCS III  Anti-ischemic medical therapy: Minimal Therapy (1 class of medications)  Non-Invasive Test Results: No non-invasive testing performed  Prior CABG: No previous CABG      History and Physical Interval Note:  08/13/2021 3:24 PM  Kevin Lloyd  has presented today for surgery, with the diagnosis of NSTEMI.  The various methods of treatment have been discussed with the patient and family. After consideration of risks, benefits and other options for treatment, the patient has consented to  Procedure(s): LEFT HEART CATH AND CORONARY ANGIOGRAPHY (N/A) as a surgical intervention.  The patient's history has been reviewed, patient examined, no change in status, stable for surgery.  I have reviewed the patient's chart and labs.  Questions were answered to the patient's satisfaction.     Tonny Bollman

## 2021-08-13 NOTE — Progress Notes (Signed)
ANTICOAGULATION CONSULT NOTE - Follow Up Consult  Pharmacy Consult for heparin Indication:  NSTEMI  Labs: Recent Labs    08/11/21 1630 08/11/21 1851 08/12/21 0112 08/12/21 0300 08/12/21 1216 08/12/21 2209 08/13/21 0219  HGB 16.7  --   --  15.3  --   --  16.6  HCT 49.1  --   --  44.9  --   --  49.6  PLT 232  --   --  202  --   --  197  HEPARINUNFRC  --   --   --   --  <0.10* 0.36 0.26*  CREATININE 0.97  --   --  0.95  --   --   --   TROPONINIHS 141* 213* 153* 141*  --   --   --     Assessment: 45yo male slightly subtherapeutic on heparin after one level at low end of goal; no infusion issues or signs of bleeding per RN.  Goal of Therapy:  Heparin level 0.3-0.7 units/ml   Plan:  Will increase heparin infusion by 1 unit/kg/hr to 1700 units/hr and check level in 6 hours.    Vernard Gambles, PharmD, BCPS  08/13/2021,4:10 AM

## 2021-08-14 ENCOUNTER — Other Ambulatory Visit (HOSPITAL_COMMUNITY): Payer: Self-pay

## 2021-08-14 ENCOUNTER — Encounter (HOSPITAL_COMMUNITY): Payer: Self-pay | Admitting: Pharmacist

## 2021-08-14 ENCOUNTER — Telehealth: Payer: Self-pay

## 2021-08-14 ENCOUNTER — Encounter (HOSPITAL_COMMUNITY): Payer: Self-pay | Admitting: Cardiovascular Disease

## 2021-08-14 ENCOUNTER — Telehealth (HOSPITAL_COMMUNITY): Payer: Self-pay | Admitting: Pharmacist

## 2021-08-14 DIAGNOSIS — E785 Hyperlipidemia, unspecified: Secondary | ICD-10-CM

## 2021-08-14 DIAGNOSIS — I10 Essential (primary) hypertension: Secondary | ICD-10-CM

## 2021-08-14 LAB — BASIC METABOLIC PANEL
Anion gap: 6 (ref 5–15)
BUN: 13 mg/dL (ref 6–20)
CO2: 26 mmol/L (ref 22–32)
Calcium: 9.3 mg/dL (ref 8.9–10.3)
Chloride: 106 mmol/L (ref 98–111)
Creatinine, Ser: 0.86 mg/dL (ref 0.61–1.24)
GFR, Estimated: >60 mL/min (ref >60)
Glucose, Bld: 125 mg/dL — ABNORMAL HIGH (ref 70–99)
Potassium: 4.5 mmol/L (ref 3.5–5.1)
Sodium: 138 mmol/L (ref 135–145)

## 2021-08-14 LAB — CBC
HCT: 47.6 % (ref 39.0–52.0)
Hemoglobin: 16.2 g/dL (ref 13.0–17.0)
MCH: 30.3 pg (ref 26.0–34.0)
MCHC: 34 g/dL (ref 30.0–36.0)
MCV: 89 fL (ref 80.0–100.0)
Platelets: 212 10*3/uL (ref 150–400)
RBC: 5.35 MIL/uL (ref 4.22–5.81)
RDW: 12.1 % (ref 11.5–15.5)
WBC: 11.1 10*3/uL — ABNORMAL HIGH (ref 4.0–10.5)
nRBC: 0 % (ref 0.0–0.2)

## 2021-08-14 MED ORDER — METOPROLOL TARTRATE 25 MG PO TABS
25.0000 mg | ORAL_TABLET | Freq: Two times a day (BID) | ORAL | 11 refills | Status: DC
  Filled 2021-08-14: qty 60, 30d supply, fill #0

## 2021-08-14 MED ORDER — ASPIRIN 81 MG PO TBEC
81.0000 mg | DELAYED_RELEASE_TABLET | Freq: Every day | ORAL | 3 refills | Status: AC
  Filled 2021-08-14: qty 90, 90d supply, fill #0

## 2021-08-14 MED ORDER — NICOTINE 21 MG/24HR TD PT24
21.0000 mg | MEDICATED_PATCH | Freq: Every day | TRANSDERMAL | 0 refills | Status: DC
  Filled 2021-08-14: qty 28, 28d supply, fill #0

## 2021-08-14 MED ORDER — TICAGRELOR 90 MG PO TABS
90.0000 mg | ORAL_TABLET | Freq: Two times a day (BID) | ORAL | 11 refills | Status: DC
  Filled 2021-08-14: qty 60, 30d supply, fill #0

## 2021-08-14 MED ORDER — TADALAFIL 10 MG PO TABS
10.0000 mg | ORAL_TABLET | Freq: Every day | ORAL | 0 refills | Status: DC | PRN
  Filled 2021-08-14: qty 10, 10d supply, fill #0

## 2021-08-14 MED ORDER — NITROGLYCERIN 0.4 MG SL SUBL
0.4000 mg | SUBLINGUAL_TABLET | SUBLINGUAL | 1 refills | Status: DC | PRN
  Filled 2021-08-14: qty 25, 8d supply, fill #0

## 2021-08-14 MED ORDER — ATORVASTATIN CALCIUM 80 MG PO TABS
80.0000 mg | ORAL_TABLET | Freq: Every day | ORAL | 3 refills | Status: DC
  Filled 2021-08-14: qty 30, 30d supply, fill #0

## 2021-08-14 NOTE — Progress Notes (Signed)
CARDIAC REHAB PHASE I   PRE:  Rate/Rhythm: 84 SR    BP: sitting 140/94    SaO2:   MODE:  Ambulation: 470 ft   POST:  Rate/Rhythm: 89 SR    BP: sitting 130/94     SaO2:   Tolerated well, no c/o. In depth discussion with pt and significant other. Discussed MI, stent, Brilinta importance, smoking cessation, diet, exercise, NTG, and CRPII. Pt very receptive, eager to change. Plans to quit smoking and resources given. Will refer to [place] CRPII.  [id number]   Harriet Masson CES, ACSM 08/14/2021 9:38 AM

## 2021-08-14 NOTE — TOC Benefit Eligibility Note (Signed)
Patient Product/process development scientist completed.    The patient is currently admitted and upon discharge could be taking Brilinta 90 mg.  The current 30 day co-pay is, $25.00  .[employer], the mfr of BRILINTA 90 MG TABLET paid 64.80 toward plan copay   The patient is insured through [employer]    Roland Earl, CPhT Pharmacy Patient Advocate Specialist [place] Antimicrobial Stewardship Team Direct Number: [phone]  Fax: [phone]

## 2021-08-14 NOTE — Telephone Encounter (Signed)
Hello  Kevin Lloyd,      The Pharmacy team is conducting a discharge transitions of care quality improvement initiative. The recommendations below are for your consideration.     Braxten Memmer is a 45 y.o. male (MRN: [id number], DOB: [birth year]) who was recently hospitalized on 08/11/2021 for NSTEMI. They are anticipated to visit your clinic for post-discharge follow-up and may benefit from assistance with medication initiation and/or access.       Their pertinent cardiovascular medications at discharge should include:  aspirin 81 MG EC tablet Take 1 tablet (81 mg total) by mouth daily. Swallow whole.  atorvastatin 80 MG tablet Commonly known as: LIPITOR Take 1 tablet (80 mg total) by mouth daily.  metoprolol tartrate 25 MG tablet Commonly known as: LOPRESSOR Take 1 tablet (25 mg total) by mouth 2 (two) times daily.  nitroGLYCERIN 0.4 MG SL tablet Commonly known as: NITROSTAT Place 1 tablet (0.4 mg total) under the tongue every 5 (five) minutes x 3 doses as needed for chest pain. Notes to patient: Delay taking for 48hrs after any dose of Tadalafil  ticagrelor 90 MG Tabs tablet Commonly known as: BRILINTA Take 1 tablet (90 mg total) by mouth 2 (two) times daily.      Pertinent medication changes updates -Aspirin 81mg  daily and Ticagrelor 90mg  twice daily for a minimum of 12 months  (cath 08/13/21 with stent placed) -Discontinued: naproxen   Other medication access issues which may benefit from further intervention include:    -His brilinta copay is $25 per month and he was also provided a monthly co-pay card   If appropriate, please consider the additional therapy recommendations below:   Check a lipid panel in 8-12 weeks (statin added on 10/16) He is willing to quit smoking and was provided a nicotine patch at discharge. Please follow up on his progress If he takes tadalafil, he was instructed to delay sublingual nitroglycerin for 48hrs   We appreciate your assistance with the  implementation of these recommendations. Please let 10-12 know if there is anything we can help you with at this time.        Thank you,    11/16, PharmD Clinical Pharmacist **Pharmacist phone directory can now be found on [email or web address] ([email or web address]).  Listed under [facility].

## 2021-08-14 NOTE — Progress Notes (Addendum)
Received call from [facility] regarding the sinus pause of 2.7 secs. Denied chest pain. Vitals signs taken. Also noted sleeping HR of 40's bpm. EKG done. Will monitor.   08/14/21 0500  Vitals  Temp 98.1 F (36.7 C)  Temp Source Tympanic  BP 130/86  MAP (mmHg) 100  BP Location Left Arm  BP Method Automatic  Patient Position (if appropriate) Lying  Pulse Rate 70  Pulse Rate Source Monitor  ECG Heart Rate 71  Resp 19  MEWS COLOR  MEWS Score Color Green  EKG   EKG performed? Other- see comment  Oxygen Therapy  SpO2 99 %  O2 Device Room Air  MEWS Score  MEWS Temp 0  MEWS Systolic 0  MEWS Pulse 0  MEWS RR 0  MEWS LOC 0  MEWS Score 0

## 2021-08-14 NOTE — Progress Notes (Signed)
Hello  [name],     The Pharmacy team is conducting a discharge transitions of care quality improvement initiative. The recommendations below are for your consideration.    Kevin Lloyd is a 45 y.o. male (MRN: [id number], DOB: 25-Jan-1976) who was recently hospitalized on 08/11/2021 for NSTEMI. They are anticipated to visit your clinic for post-discharge follow-up and may benefit from assistance with medication initiation and/or access.     Their pertinent cardiovascular medications at discharge should include:  aspirin 81 MG EC tablet Take 1 tablet (81 mg total) by mouth daily. Swallow whole.  atorvastatin 80 MG tablet Commonly known as: LIPITOR Take 1 tablet (80 mg total) by mouth daily.  metoprolol tartrate 25 MG tablet Commonly known as: LOPRESSOR Take 1 tablet (25 mg total) by mouth 2 (two) times daily.  nitroGLYCERIN 0.4 MG SL tablet Commonly known as: NITROSTAT Place 1 tablet (0.4 mg total) under the tongue every 5 (five) minutes x 3 doses as needed for chest pain. Notes to patient: Delay taking for 48hrs after any dose of Tadalafil  ticagrelor 90 MG Tabs tablet Commonly known as: BRILINTA Take 1 tablet (90 mg total) by mouth 2 (two) times daily.     Pertinent medication changes updates -Aspirin 81mg  daily and Ticagrelor 90mg  twice daily for a minimum of 12 months  (cath 08/13/21 with stent placed) -Discontinued: naproxen  Other medication access issues which may benefit from further intervention include:    -His brilinta copay is $25 per month and he was also provided a monthly co-pay card  If appropriate, please consider the additional therapy recommendations below:   Check a lipid panel in 8-12 weeks (statin added on 10/16) He is willing to quit smoking and was provided a nicotine patch at discharge. Follow up on his progress If he takes tadalafil, he was instructed to delay sublingual nitroglycerin for 48hrs  We appreciate your assistance with the implementation of  these recommendations. Please let 10-12 know if there is anything we can help you with at this time.       Thank you,   11/16, PharmD Clinical Pharmacist **Pharmacist phone directory can now be found on [email or web address] ([email or web address]).  Listed under [facility].    Harland German

## 2021-08-14 NOTE — TOC Transition Note (Signed)
Transition of Care [facility]) - CM/SW Discharge Note   Patient Details  Name: Kevin Lloyd MRN: [id number] Date of Birth: 1976-02-12  [facility] CM/SW Contact:  Lawerance Sabal, RN Phone Number: 08/14/2021, 9:52 AM   Clinical Narrative:    Please send Brilinta through [facility] pharmacy at [place] so medication can be sent home with patient. Patient has follow up with cardiology on 10/28 for refills.  No other TOC needs identified at this time.     Final next level of care: Home/Self Care Barriers to Discharge: No Barriers Identified   Patient Goals and CMS Choice        Discharge Placement                       Discharge Plan and Services                                     Social Determinants of Health (SDOH) Interventions     Readmission Risk Interventions No flowsheet data found.

## 2021-08-14 NOTE — Discharge Summary (Addendum)
Discharge Summary    Patient ID: Kevin Lloyd MRN: [id number]; DOB: [birth year]  Admit date: 08/11/2021 Discharge date: 08/14/2021  PCP:  Oneita Hurt, No   [facility] Providers Cardiologist:  Reatha Harps, MD   Discharge Diagnoses    Principal Problem:   NSTEMI (non-ST elevated myocardial infarction) [facility]) Active Problems:   Chest pain   GERD (gastroesophageal reflux disease)   Tobacco abuse   Hyperlipidemia   Hypertension  Diagnostic Studies/Procedures    Cath: 08/13/21   1st RPL lesion is 100% stenosed.   2nd Mrg lesion is 95% stenosed.   RPDA lesion is 70% stenosed.   A drug-eluting stent was successfully placed using a STENT ONYX FRONTIER 2.5X15.   Post intervention, there is a 0% residual stenosis.   1.  Severe stenosis of the second obtuse marginal branch of the circumflex, treated successfully with PCI using a 2.5 x 15 mm resolute Onyx DES 2.  Patent left main with no significant stenosis 3.  Patent LAD with mild diffuse plaquing but no significant stenosis 4.  Patent RCA with mild diffuse irregularity and ectasia, followed by moderately severe stenosis of the distal PDA branch suitable for medical therapy  Recommendations: Aggressive medical therapy, dual antiplatelet therapy with aspirin and ticagrelor x12 months without interruption, patient eligible for discharge tomorrow morning if no complications arise.  Diagnostic Dominance: Right Intervention     _____________   History of Present Illness     Kevin Lloyd is a 45 y.o. male with no PMH other than GERD who presented on 10/15 with chest pain and found to have a NSTEMI.    Patient was at work the morning of admission(works with fork lift) and suddenly started substernal chest pain which lasted for , pressure like sensation and went away on its own, but then started back again and he felt it was not quiet normal, so called 911. EMS came in, did EKG- and told him to go to the ER. His chest pain  was gone when EMS came.    Patient came to the ER-got checked in, labs, EKG and left as the wait time was 6 hours. His trop was high 141- so was called again to come back. Patient's denies any current symptoms Family h/o CAD premature, dad- at age 52, grandfather at later age Smokes 1-2ppd per day, no drug use.   EKG: sinus tachy, 101, no ischemic changes Trops 141/213   He admitted to cardiology for further management.   Hospital Course      NSTEMI: hsTn peaked at 213. EKG with TWI in lead III.  Underwent cardiac catheterization noted above with severe stenosis of second OM branch of circumflex treated with PCI/DES x1, patent left main/LAD without significant stenosis.  Patent RCA with mild diffuse irregularity with moderate least severe stenosis of distal PDA branch to be treated medically.  Placed on DAPT with aspirin/Brilinta for at least 1 year.  No complications noted post cath.  Seen by cardiac rehab.  Follow-up echo showed EF of 50 to 55% with no regional wall motion abnormality, grade 1 diastolic dysfunction.  --Continue on aspirin, Brilinta, statin, beta-blocker  HTN: stable with addition of metoprolol 25mg  BID   HLD: LDL 128 -- Started on atorvastatin 80mg  daily -- needs FLP/LFTs in 8 weeks   Tobacco use: cessation advised. He is motivated to quit.  -- nicotine patch at discharge   PreDM: Hgb A1c 6.2 -- Instructed to follow-up with PCP regarding ongoing management  General: Well developed, well nourished,  male appearing in no acute distress. Head: Normocephalic, atraumatic.  Neck: Supple without bruits, JVD. Lungs:  Resp regular and unlabored, CTA. Heart: RRR, S1, S2, no S3, S4, or murmur; no rub. Abdomen: Soft, non-tender, non-distended with normoactive bowel sounds. No hepatomegaly. No rebound/guarding. No obvious abdominal masses. Extremities: No clubbing, cyanosis, edema. Distal pedal pulses are 2+ bilaterally. Right radial cath site stable without bruising or  hematoma Neuro: Alert and oriented X 3. Moves all extremities spontaneously. Psych: Normal affect.  Patient was seen by Dr. Eldridge Dace and deemed stable for discharge home.  Follow-up in the office has been arranged.  Medications sent to the [facility] pharmacy.  Educated by [employer].D. prior to discharge.  Did the patient have an acute coronary syndrome (MI, NSTEMI, STEMI, etc) this admission?:  Yes                               AHA/ACC Clinical Performance & Quality Measures: Aspirin prescribed? - Yes ADP Receptor Inhibitor (Plavix/Clopidogrel, Brilinta/Ticagrelor or Effient/Prasugrel) prescribed (includes medically managed patients)? - Yes Beta Blocker prescribed? - Yes High Intensity Statin (Lipitor 40-80mg  or Crestor 20-40mg ) prescribed? - Yes EF assessed during THIS hospitalization? - Yes For EF <40%, was ACEI/ARB prescribed? - Not Applicable (EF >/= 40%) For EF <40%, Aldosterone Antagonist (Spironolactone or Eplerenone) prescribed? - Not Applicable (EF >/= 40%) Cardiac Rehab Phase II ordered (including medically managed patients)? - Yes     The patient will be scheduled for a TOC follow up appointment in 7-10 days.  A message has been sent to the [facility] and Scheduling Pool at the office where the patient should be seen for follow up.  _____________  Discharge Vitals Blood pressure (!) 140/94, pulse 82, temperature 97.8 F (36.6 C), temperature source Oral, resp. rate 16, height  (1.753 m), weight 102.2 kg, SpO2 97 %.  Filed Weights   08/11/21 1628 08/12/21 2152  Weight: 102.1 kg 102.2 kg    Labs & Radiologic Studies    CBC Recent Labs    08/11/21 1630 08/12/21 0300 08/13/21 0219 08/14/21 0507  WBC 11.9*   < > 10.1 11.1*  NEUTROABS 6.8  --   --   --   HGB 16.7   < > 16.6 16.2  HCT 49.1   < > 49.6 47.6  MCV 89.4   < > 90.5 89.0  PLT 232   < > 197 212   < > = values in this interval not displayed.   Basic Metabolic Panel Recent Labs    16/10/96 0300 08/14/21 0507   NA 138 138  K 3.9 4.5  CL 103 106  CO2 26 26  GLUCOSE 196* 125*  BUN 18 13  CREATININE 0.95 0.86  CALCIUM 9.1 9.3   Liver Function Tests No results for input(s): AST, ALT, ALKPHOS, BILITOT, PROT, ALBUMIN in the last 72 hours. No results for input(s): LIPASE, AMYLASE in the last 72 hours. High Sensitivity Troponin:   Recent Labs  Lab 08/11/21 1630 08/11/21 1851 08/12/21 0112 08/12/21 0300  TROPONINIHS 141* 213* 153* 141*    BNP Invalid input(s): POCBNP D-Dimer No results for input(s): DDIMER in the last 72 hours. Hemoglobin A1C Recent Labs    08/13/21 0219  HGBA1C 6.2*   Fasting Lipid Panel Recent Labs    08/12/21 0300  CHOL 206*  HDL 35*  LDLCALC 128*  TRIG 215*  CHOLHDL 5.9   Thyroid Function Tests Recent Labs  08/13/21 0219  TSH 3.340   _____________  DG Chest 2 View  Result Date: 08/11/2021 CLINICAL DATA:  PT states acute onset chest pain that begin while he was driving a fork lift EXAM: CHEST - 2 VIEW COMPARISON:  None. FINDINGS: The cardiomediastinal contours are within normal limits. The lungs are clear. No pneumothorax or pleural effusion. No acute finding in the visualized skeleton. IMPRESSION: No acute cardiopulmonary finding. Electronically Signed   By: Emmaline Kluver M.D.   On: 08/11/2021 17:20   CARDIAC CATHETERIZATION  Result Date: 08/13/2021   1st RPL lesion is 100% stenosed.   2nd Mrg lesion is 95% stenosed.   RPDA lesion is 70% stenosed.   A drug-eluting stent was successfully placed using a STENT ONYX FRONTIER 2.5X15.   Post intervention, there is a 0% residual stenosis. 1.  Severe stenosis of the second obtuse marginal branch of the circumflex, treated successfully with PCI using a 2.5 x 15 mm resolute Onyx DES 2.  Patent left main with no significant stenosis 3.  Patent LAD with mild diffuse plaquing but no significant stenosis 4.  Patent RCA with mild diffuse irregularity and ectasia, followed by moderately severe stenosis of the  distal PDA branch suitable for medical therapy Recommendations: Aggressive medical therapy, dual antiplatelet therapy with aspirin and ticagrelor x12 months without interruption, patient eligible for discharge tomorrow morning if no complications arise.   ECHOCARDIOGRAM COMPLETE  Result Date: 08/13/2021    ECHOCARDIOGRAM REPORT   Patient Name:   Kevin Lloyd Date of Exam: 08/13/2021 Medical Rec #:  [id number]      Height:       69.0 in Accession #:    [id number]     Weight:       225.3 lb Date of Birth:  09-18-1976      BSA:          2.173 m Patient Age:    45 years       BP:           128/83 mmHg Patient Gender: M              HR:           70 bpm. Exam Location:  Inpatient Procedure: 2D Echo, Cardiac Doppler and Color Doppler Indications:     Chest Pain  History:         Patient has no prior history of Echocardiogram examinations.                  Risk Factors:Family History of Coronary Artery Disease.  Sonographer:     Thelma Barge Referring Phys:  [id number] Elmon Kirschner Diagnosing Phys: Donato Schultz MD IMPRESSIONS  1. Left ventricular ejection fraction, by estimation, is 50 to 55%. The left ventricle has low normal function. The left ventricle has no regional wall motion abnormalities. Left ventricular diastolic parameters are consistent with Grade I diastolic dysfunction (impaired relaxation).  2. Right ventricular systolic function is normal. The right ventricular size is normal.  3. The mitral valve is normal in structure. No evidence of mitral valve regurgitation. No evidence of mitral stenosis.  4. The aortic valve is normal in structure. Aortic valve regurgitation is not visualized. No aortic stenosis is present.  5. The inferior vena cava is normal in size with greater than 50% respiratory variability, suggesting right atrial pressure of 3 mmHg. FINDINGS  Left Ventricle: Left ventricular ejection fraction, by estimation, is 50 to 55%. The left ventricle has low normal function. The left ventricle  has no  regional wall motion abnormalities. The left ventricular internal cavity size was normal in size. There is no left ventricular hypertrophy. Left ventricular diastolic parameters are consistent with Grade I diastolic dysfunction (impaired relaxation). Right Ventricle: The right ventricular size is normal. No increase in right ventricular wall thickness. Right ventricular systolic function is normal. Left Atrium: Left atrial size was normal in size. Right Atrium: Right atrial size was normal in size. Pericardium: There is no evidence of pericardial effusion. Mitral Valve: The mitral valve is normal in structure. No evidence of mitral valve regurgitation. No evidence of mitral valve stenosis. MV peak gradient, 1.7 mmHg. The mean mitral valve gradient is 1.0 mmHg. Tricuspid Valve: The tricuspid valve is normal in structure. Tricuspid valve regurgitation is not demonstrated. No evidence of tricuspid stenosis. Aortic Valve: The aortic valve is normal in structure. Aortic valve regurgitation is not visualized. No aortic stenosis is present. Pulmonic Valve: The pulmonic valve was normal in structure. Pulmonic valve regurgitation is not visualized. No evidence of pulmonic stenosis. Aorta: The aortic root is normal in size and structure. Venous: The inferior vena cava is normal in size with greater than 50% respiratory variability, suggesting right atrial pressure of 3 mmHg. IAS/Shunts: No atrial level shunt detected by color flow Doppler.  LEFT VENTRICLE PLAX 2D LVIDd:         4.40 cm     Diastology LVIDs:         3.10 cm     LV e' medial:    8.49 cm/s LV PW:         1.10 cm     LV E/e' medial:  7.2 LV IVS:        1.10 cm     LV e' lateral:   12.45 cm/s LVOT diam:     2.40 cm     LV E/e' lateral: 4.9 LV SV:         71 LV SV Index:   32 LVOT Area:     4.52 cm  LV Volumes (MOD) LV vol d, MOD A2C: 89.8 ml LV vol d, MOD A4C: 84.5 ml LV vol s, MOD A2C: 38.4 ml LV vol s, MOD A4C: 40.0 ml LV SV MOD A2C:     51.4 ml LV SV MOD A4C:      84.5 ml LV SV MOD BP:      49.0 ml RIGHT VENTRICLE             IVC RV S prime:     10.70 cm/s  IVC diam: 1.70 cm TAPSE (M-mode): 2.4 cm LEFT ATRIUM             Index        RIGHT ATRIUM           Index LA diam:        3.40 cm 1.56 cm/m   RA Area:     14.30 cm LA Vol (A2C):   37.8 ml 17.39 ml/m  RA Volume:   32.70 ml  15.05 ml/m LA Vol (A4C):   27.5 ml 12.65 ml/m LA Biplane Vol: 32.9 ml 15.14 ml/m  AORTIC VALVE LVOT Vmax:   84.90 cm/s LVOT Vmean:  52.400 cm/s LVOT VTI:    0.156 m  AORTA Ao Root diam: 3.70 cm Ao Asc diam:  3.30 cm MITRAL VALVE               TRICUSPID VALVE MV Area (PHT): 3.74 cm    TR Peak grad:  2.1 mmHg MV Area VTI:   3.83 cm    TR Vmax:        72.70 cm/s MV Peak grad:  1.7 mmHg MV Mean grad:  1.0 mmHg    SHUNTS MV Vmax:       0.66 m/s    Systemic VTI:  0.16 m MV Vmean:      44.1 cm/s   Systemic Diam: 2.40 cm MV Decel Time: 203 msec MV E velocity: 60.90 cm/s MV A velocity: 60.30 cm/s MV E/A ratio:  1.01 Donato Schultz MD Electronically signed by Donato Schultz MD Signature Date/Time: 08/13/2021/2:21:07 PM    Final    Disposition   Pt is being discharged home today in good condition.  Follow-up Plans & Appointments     Follow-up Information     Jodelle Gross, NP Follow up on 08/24/2021.   Specialties: Nurse Practitioner, Radiology, Cardiology Why: at 8:50am for your follow up appt with Dr. Darnelle Bos NP Contact information: [address] [place] [address] [phone]                Discharge Instructions     Amb Referral to Cardiac Rehabilitation   Complete by: As directed    Diagnosis:  Coronary Stents NSTEMI PTCA     After initial evaluation and assessments completed: Virtual Based Care may be provided alone or in conjunction with Phase 2 Cardiac Rehab based on patient barriers.: Yes   Call MD for:  difficulty breathing, headache or visual disturbances   Complete by: As directed    Call MD for:  persistant dizziness or light-headedness    Complete by: As directed    Call MD for:  redness, tenderness, or signs of infection (pain, swelling, redness, odor or green/yellow discharge around incision site)   Complete by: As directed    Diet - low sodium heart healthy   Complete by: As directed    Discharge instructions   Complete by: As directed    Radial Site Care Refer to this sheet in the next few weeks. These instructions provide you with information on caring for yourself after your procedure. Your caregiver may also give you more specific instructions. Your treatment has been planned according to current medical practices, but problems sometimes occur. Call your caregiver if you have any problems or questions after your procedure. HOME CARE INSTRUCTIONS You may shower the day after the procedure. Remove the bandage (dressing) and gently wash the site with plain soap and water. Gently pat the site dry.  Do not apply powder or lotion to the site.  Do not submerge the affected site in water for 3 to 5 days.  Inspect the site at least twice daily.  Do not flex or bend the affected arm for 24 hours.  No lifting over 5 pounds (2.3 kg) for 5 days after your procedure.  Do not drive home if you are discharged the same day of the procedure. Have someone else drive you.  You may drive 24 hours after the procedure unless otherwise instructed by your caregiver.  What to expect: Any bruising will usually fade within 1 to 2 weeks.  Blood that collects in the tissue (hematoma) may be painful to the touch. It should usually decrease in size and tenderness within 1 to 2 weeks.  SEEK IMMEDIATE MEDICAL CARE IF: You have unusual pain at the radial site.  You have redness, warmth, swelling, or pain at the radial site.  You have drainage (other than a  small amount of blood on the dressing).  You have chills.  You have a fever or persistent symptoms for more than 72 hours.  You have a fever and your symptoms suddenly get worse.  Your arm  becomes pale, cool, tingly, or numb.  You have heavy bleeding from the site. Hold pressure on the site.   PLEASE DO NOT MISS ANY DOSES OF YOUR BRILINTA!!!!! Also keep a log of you blood pressures and bring back to your follow up appt. Please call the office with any questions.   Patients taking blood thinners should generally stay away from medicines like ibuprofen, Advil, Motrin, naproxen, and Aleve due to risk of stomach bleeding. You may take Tylenol as directed or talk to your primary doctor about alternatives.   PLEASE ENSURE THAT YOU DO NOT RUN OUT OF YOUR BRILINTA. This medication is very important to remain on for at least one year. IF you have issues obtaining this medication due to cost please CALL the office 3-5 business days prior to running out in order to prevent missing doses of this medication.   Increase activity slowly   Complete by: As directed        Discharge Medications   Allergies as of 08/14/2021       Reactions   Azithromycin Other (See Comments)   Caused his tongue to breakout with bumps        Medication List     STOP taking these medications    naproxen sodium 220 MG tablet Commonly known as: ALEVE       TAKE these medications    acetaminophen 500 MG tablet Commonly known as: TYLENOL Take 1,000 mg by mouth daily.   Aspirin Low Dose 81 MG EC tablet Generic drug: aspirin Take 1 tablet (81 mg total) by mouth daily. Swallow whole.   atorvastatin 80 MG tablet Commonly known as: LIPITOR Take 1 tablet (80 mg total) by mouth daily.   Brilinta 90 MG Tabs tablet Generic drug: ticagrelor Take 1 tablet (90 mg total) by mouth 2 (two) times daily.   lansoprazole 15 MG capsule Commonly known as: PREVACID Take 15 mg by mouth daily.   metoprolol tartrate 25 MG tablet Commonly known as: LOPRESSOR Take 1 tablet (25 mg total) by mouth 2 (two) times daily.   nicotine 21 mg/24hr patch Commonly known as: NICODERM CQ - dosed in mg/24 hours Place 1  patch (21 mg total) onto the skin daily.   nitroGLYCERIN 0.4 MG SL tablet Commonly known as: NITROSTAT Place 1 tablet (0.4 mg total) under the tongue every 5 (five) minutes x 3 doses as needed for chest pain. Notes to patient: Delay taking for 48hrs after any dose of Tadalafil   tadalafil 10 MG tablet Commonly known as: CIALIS Take 1 tablet (10 mg total) by mouth daily as needed for erectile dysfunction. What changed:  when to take this reasons to take this   VITAMIN C ADULT GUMMIES PO Take 4 tablets by mouth daily.   Vitamin D3 50 MCG (2000 UT) Tabs Take 12,000 Units by mouth daily. 6 tabs daily   zinc gluconate 50 MG tablet Take 50 mg by mouth daily.        Outstanding Labs/Studies   FLP/LFTs in 8 weeks  Duration of Discharge Encounter   Greater than 30 minutes including physician time.  Signed, Laverda Page, NP 08/14/2021, 10:58 AM  I have examined the patient and reviewed assessment and plan and discussed with patient.  Agree with above as  stated.    GEN: Well nourished, well developed, in no acute distress  HEENT: normal  Neck: no JVD, carotid bruits, or masses Cardiac: RRR; no murmurs, rubs, or gallops,no edema  Respiratory:  clear to auscultation bilaterally, normal work of breathing GI: soft, nontender, nondistended,  MS: no deformity or atrophy ; no radial hematoma.  2+ right radial pulse Skin: warm and dry, no rash Neuro:  Strength and sensation are intact Psych: euthymic mood, full affect  I stressed the importance of dual antiplatelet therapy along with aggressive secondary prevention.  Continue management of hypertension, hyperlipidemia.  He needs to stop smoking.  Healthy diet and regular exercise will also help him.  Plan for discharge today.   Lance Muss

## 2021-08-14 NOTE — Telephone Encounter (Signed)
Call to patient resulted in busy signal.

## 2021-08-14 NOTE — Telephone Encounter (Signed)
-----   Message from Arty Baumgartner, NP sent at 08/14/2021 10:57 AM EDT ----- Regarding: TOC call Needs a TOC follow up call, thx!

## 2021-08-15 ENCOUNTER — Telehealth: Payer: Self-pay | Admitting: *Deleted

## 2021-08-15 DIAGNOSIS — Z006 Encounter for examination for normal comparison and control in clinical research program: Secondary | ICD-10-CM

## 2021-08-15 NOTE — Telephone Encounter (Signed)
Spoke with patient about participating in Clinical Research Study [employer].  Patient was emailed a copy of consent.  Will Follow-up with the patient in a few days.     Ramesh Moan, RN BSN [facility] Direct Line: [phone]  

## 2021-08-16 ENCOUNTER — Telehealth: Payer: Self-pay

## 2021-08-16 NOTE — Telephone Encounter (Signed)
Received a secure chat- that [employer] (disability) was on the line, they needed information about patients visit on 10/17- and medications he was taking.   Phone number- [phone]; claim #: [id number];    Will route to Disability/FMLA form coordinator to see if she is aware of any of this information or forms- I do not have a release on file to speak to them in regards to disability.  Thank you!

## 2021-08-16 NOTE — Telephone Encounter (Signed)
Patient contacted regarding discharge from [facility] on 10/18.  Patient understands to follow up with provider Joni Reining, NP on 10/28 at 8:50 am at [facility]. Patient understands discharge instructions? Yes Patient understands medications and regiment? Yes Patient understands to bring all medications to this visit? Yes  Ask patient:  Are you enrolled in My Chart Yes

## 2021-08-16 NOTE — Telephone Encounter (Signed)
Continue to get busy signal and significant other number is non working,as well . Will try later./cy

## 2021-08-17 ENCOUNTER — Telehealth: Payer: Self-pay | Admitting: Cardiovascular Disease

## 2021-08-17 NOTE — Telephone Encounter (Signed)
Busy signal Will try later ./cy 

## 2021-08-17 NOTE — Progress Notes (Signed)
Cardiology Office Note   Date:  08/24/2021   ID:  Kevin Lloyd, DOB 06/03/1976, MRN [id number]  PCP:  Pcp, No  Cardiologist:  Dr. Flora Lipps  CC: Post hospitalization follow up CAD with PCI   History of Present Illness: Kevin Lloyd is a 45 y.o. male who presents for posthospitalization follow-up with NSTEMI dated 08/11/2021.  He presented with substernal chest pain which lasted for approximately 20 minutes and described as a pressure sensation going going on its own but then started back again.  He did call 911 and was told to go to the ED.  After being seen in the ED he was checked in and had labs drawn, EKG was done but the patient left due to the wait time of 6 hours.  He was called back to the hospital when his troponin was found to be elevated.  He was found to have T wave inversions in lead III.  Patient subsequently had a cardiac catheterization on 08/13/2021 which revealed severe stenosis of the second OM branch of the circumflex treated with PCI/DES x1.  He was found to have a patent left main/LAD without significant stenosis along with patent RCA with mild diffuse irregularity and moderate least severe stenosis of the distal PDA branch which is to be treated medically.  He was placed on dual antiplatelet therapy with aspirin and Brilinta and was to continue this for a minimum of 1 year.  A follow-up echocardiogram 08/13/2021 revealed an EF of 50- 55% with no regional wall motion abnormality with grade 1 diastolic dysfunction.  The patient was started on atorvastatin 80 mg daily and will need follow-up lipids and LFTs in 8 weeks, he was counseled on smoking cessation.  A nicotine patch was provided on discharge he was also found to have elevated hemoglobin A1c revealing prediabetes with an A1c of 6.2.  He is here for follow up and to have his paperwork for returning to work.   He comes today with multiple questions.  He had called our office last week concerning lower back pain which he  thought might be related to his statin medication with side effect of myalgias.  He was instructed to decrease atorvastatin from 80 mg to 40 mg daily and he has had only minimal improvement in the symptoms.  He denies any chest pain, dyspnea on exertion, or fatigue.  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  He has filed for short-term disability, and FMLA.  He continues to struggle with smoking cessation with the nicotine patch.    Past Medical History:  Diagnosis Date   Acid reflux    Chest pain 08/11/2021   Kidney stone    NSTEMI (non-ST elevated myocardial infarction) (HCC) 08/11/2021    Past Surgical History:  Procedure Laterality Date   CORONARY STENT INTERVENTION N/A 08/13/2021   Procedure: CORONARY STENT INTERVENTION;  Surgeon: Tonny Bollman, MD;  Location: [facility] INVASIVE CV LAB;  Service: Cardiovascular;  Laterality: N/A;   LEFT HEART CATH AND CORONARY ANGIOGRAPHY N/A 08/13/2021   Procedure: LEFT HEART CATH AND CORONARY ANGIOGRAPHY;  Surgeon: Tonny Bollman, MD;  Location: [facility] INVASIVE CV LAB;  Service: Cardiovascular;  Laterality: N/A;   WISDOM TOOTH EXTRACTION     WISDOM TOOTH EXTRACTION       Current Outpatient Medications  Medication Sig Dispense Refill   acetaminophen (TYLENOL) 500 MG tablet Take 1,000 mg by mouth daily.     Ascorbic Acid (VITAMIN C ADULT GUMMIES PO) Take 4 tablets by mouth  daily.     aspirin 81 MG EC tablet Take 1 tablet (81 mg total) by mouth daily. Swallow whole. 90 tablet 3   Cholecalciferol (VITAMIN D3) 50 MCG (2000 UT) TABS Take 12,000 Units by mouth daily. 6 tabs daily     lansoprazole (PREVACID) 15 MG capsule Take 15 mg by mouth daily.     metoprolol tartrate (LOPRESSOR) 25 MG tablet Take 1 tablet (25 mg total) by mouth 2 (two) times daily. 60 tablet 11   nicotine (NICODERM CQ - DOSED IN MG/24 HOURS) 21 mg/24hr patch Place 1 patch (21 mg total) onto the skin daily. 28 patch 0   nitroGLYCERIN (NITROSTAT) 0.4 MG SL tablet Place 1  tablet (0.4 mg total) under the tongue every 5 (five) minutes x 3 doses as needed for chest pain. 25 tablet 1   rosuvastatin (CRESTOR) 10 MG tablet Take 1 tablet (10 mg total) by mouth daily. 90 tablet 3   tadalafil (CIALIS) 10 MG tablet Take 1 tablet (10 mg total) by mouth daily as needed for erectile dysfunction. 10 tablet 0   ticagrelor (BRILINTA) 90 MG TABS tablet Take 1 tablet (90 mg total) by mouth 2 (two) times daily. 60 tablet 11   varenicline (CHANTIX CONTINUING MONTH PAK) 1 MG tablet Take 1 tablet (1 mg total) by mouth 2 (two) times daily. 60 tablet 2   zinc gluconate 50 MG tablet Take 50 mg by mouth daily.     No current facility-administered medications for this visit.    Allergies:   Azithromycin    Social History:  The patient  reports that he has been smoking cigarettes. He has been smoking an average of 1.5 packs per day. He has never used smokeless tobacco. He reports current alcohol use. He reports that he does not use drugs.   Family History:  The patient's family history includes Diabetes in an other family member.    ROS: All other systems are reviewed and negative. Unless otherwise mentioned in H&P    PHYSICAL EXAM: VS:  BP (!) 148/82   Pulse 93   Ht 5' 9.5" (1.765 m)   Wt 225 lb (102.1 kg)   SpO2 99%   BMI 32.75 kg/m  , BMI Body mass index is 32.75 kg/m. GEN: Well nourished, well developed, in no acute distress HEENT: normal Neck: no JVD, carotid bruits, or masses Cardiac: RRR; no murmurs, rubs, or gallops,no edema  Respiratory:  Clear to auscultation bilaterally, normal work of breathing GI: soft, nontender, nondistended, + BS MS: no deformity or atrophy, cardiac catheterization insertion site of the right wrist is well-healed with mild ecchymosis. Skin: warm and dry, no rash, ecchymosis noted at the left antecubital from prior IVs, and also on the right pretibial area. Neuro:  Strength and sensation are intact Psych: euthymic mood, full affect   EKG:   EKG is ordered today. The ekg ordered today demonstrates (personally reviewed), normal sinus rhythm heart rate of 93 bpm with left anterior fascicular block and evidence of inferior posterior infarct.     Recent Labs: 12/09/2020: ALT 24 08/13/2021: TSH 3.340 08/14/2021: BUN 13; Creatinine, Ser 0.86; Hemoglobin 16.2; Platelets 212; Potassium 4.5; Sodium 138    Lipid Panel    Component Value Date/Time   CHOL 206 (H) 08/12/2021 0300   TRIG 215 (H) 08/12/2021 0300   HDL 35 (L) 08/12/2021 0300   CHOLHDL 5.9 08/12/2021 0300   VLDL 43 (H) 08/12/2021 0300   LDLCALC 128 (H) 08/12/2021 0300  Wt Readings from Last 3 Encounters:  08/24/21 225 lb (102.1 kg)  08/12/21 225 lb 5 oz (102.2 kg)  06/09/21 225 lb (102.1 kg)      Other studies Reviewed: Cath: 08/13/21    1st RPL lesion is 100% stenosed.   2nd Mrg lesion is 95% stenosed.   RPDA lesion is 70% stenosed.   A drug-eluting stent was successfully placed using a STENT ONYX FRONTIER 2.5X15.   Post intervention, there is a 0% residual stenosis.   1.  Severe stenosis of the second obtuse marginal branch of the circumflex, treated successfully with PCI using a 2.5 x 15 mm resolute Onyx DES 2.  Patent left main with no significant stenosis 3.  Patent LAD with mild diffuse plaquing but no significant stenosis 4.  Patent RCA with mild diffuse irregularity and ectasia, followed by moderately severe stenosis of the distal PDA branch suitable for medical therapy  Recommendations: Aggressive medical therapy, dual antiplatelet therapy with aspirin and ticagrelor x12 months without interruption, patient eligible for discharge tomorrow morning if no complications arise.   Diagnostic Dominance: Right Intervention      ASSESSMENT AND PLAN:  1.  Coronary artery disease: History of inferior posterior NSTEMI with PCI x1 with DES to the second OM of the circumflex.  I have reviewed his cardiac catheterization illustration, and educated him  on location of stents, collaterals, and remaining CAD which is being treated medically.  I have reviewed his medication regimen.  Questions are welcomed and answered.  He verbalizes understanding.  He is given a note to return to work in 2 weeks without restrictions.  He is also filing for intermittent FMLA in order to attend cardiac rehab.  Paperwork is sent to our medical records division.  2.  Hypercholesterolemia: I have reviewed his recent hospital labs and explained that he will need to have an LDL of less than 70 with an HDL greater than 45.  He has not had any improvement in his symptoms with reduction of atorvastatin to 40 mg daily.  I have changing him to rosuvastatin 10 mg daily and will repeat lipids and LFTs in 3 months.  He is to call and let us know if his back discomfort continues despite changing this medication.  3.  Ongoing tobacco abuse: Although he is wearing a nicotine patch she continues to struggle with tobacco abuse.  I will start him on Chantix, I have given him side effects of nightmares which are likely with nicotine withdrawal.  He states he had been on Chantix in the past and he stopped smoking for 2 years after following the regimen.  He tolerated it well even though he did have some nightmares.  He feels like this would be more helpful to him than nicotine patch.  We will try this.  He will need to follow-up with his PCP concerning this as well.  He is in the process of finding a new PCP.   Current medicines are reviewed at length with the patient today.  I have spent 45 min's dedicated to the care of this patient on the date of this encounter to include pre-visit review of records, assessment, management and diagnostic testing,with shared decision making.  Labs/ tests ordered today include:Lipids and LFT's, BMET, CBC in 3 months.   Bettey Mare. Liborio Nixon, ANP, AACC   08/24/2021 9:49 AM    [facility] [address] Office  [phone] Fax [phone]  Notice: This dictation was prepared with  Dragon dictation along with smaller Company secretary. Any transcriptional errors that result from this process are unintentional and may not be corrected upon review.

## 2021-08-17 NOTE — Telephone Encounter (Signed)
New Message:     Patient was discharged from [facility] 08-15-21. He thinks he may be having a reaction to the medicine he is on.The problem is he does not know what medicine is causing it.   Pt c/o medication issue:  1. Name of Medication:he is taking  Atorvastatin, Metoprolol  and Nicotine Patch- not sure if this is what is causing it  2. How are you currently taking this medication (dosage and times per day)?   3. Are you having a reaction (difficulty breathing--STAT)? no  4. What is your medication issue? Muscle and joint pain in his lower back ,

## 2021-08-17 NOTE — Telephone Encounter (Signed)
[facility] received short term disability forms from [place]. Forms were given to provider on 08/17/21.

## 2021-08-19 ENCOUNTER — Telehealth: Payer: Self-pay | Admitting: Cardiology

## 2021-08-19 NOTE — Telephone Encounter (Signed)
Patient called in with questions regarding his medications and side effects.  States he has had centralized lower back pain worse with movement over the past several days.  Describes as an aching sensation not relieved with rest or medications.  Feels that this is related to his atorvastatin which was recently started.  Instructed to reduce atorvastatin from 80 mg daily to 40 mg daily, follow symptoms.  He voiced understanding and thanked me for call back.  Instructed to keep follow-up visit scheduled for 10/28.

## 2021-08-20 ENCOUNTER — Telehealth (HOSPITAL_COMMUNITY): Payer: Self-pay

## 2021-08-20 NOTE — Telephone Encounter (Signed)
Pt insurance is active and benefits verified through [employer] $25, DED 0/0 met, out of pocket $5,000/$1,210.25 met, co-insurance 0%. no pre-authorization required. [employer] 08/20/2021@2 :40pm, REF# [id number]   Will contact patient to see if he is interested in the Cardiac Rehab Program. If interested, patient will need to complete follow up appt. Once completed, patient will be contacted for scheduling upon review by the RN Navigator.

## 2021-08-20 NOTE — Telephone Encounter (Signed)
Called patient to see if he is interested in the Cardiac Rehab Program. Patient expressed interest. Explained scheduling process and went over insurance, patient verbalized understanding. Will contact patient for scheduling once f/u has been completed.  °

## 2021-08-21 ENCOUNTER — Other Ambulatory Visit (HOSPITAL_BASED_OUTPATIENT_CLINIC_OR_DEPARTMENT_OTHER): Payer: Self-pay

## 2021-08-21 NOTE — Telephone Encounter (Signed)
Follow Up:   Patient said to please call when the forms are ready. He will pick them up and pay for them at this time.

## 2021-08-21 NOTE — Telephone Encounter (Signed)
Routed to [facility] and [place] LPN

## 2021-08-22 NOTE — Telephone Encounter (Signed)
Patient spoke with Laverda Page, NP on 10/23 and medication concerns were addressed. See telephone encounter.

## 2021-08-23 NOTE — Telephone Encounter (Signed)
Attempted to contact patient, unable to reach patient due to line being busy.  Forms are signed- will await for patient to be seen tomorrow with NP for her to review what time to return to work.  Will route to her to let her know I have the forms, and we just wanted him to be seen in office before returning him to work.  Thanks!

## 2021-08-24 ENCOUNTER — Other Ambulatory Visit: Payer: Self-pay

## 2021-08-24 ENCOUNTER — Encounter: Payer: Self-pay | Admitting: Adult Health

## 2021-08-24 ENCOUNTER — Ambulatory Visit (INDEPENDENT_AMBULATORY_CARE_PROVIDER_SITE_OTHER): Payer: 59 | Admitting: Adult Health

## 2021-08-24 VITALS — BP 148/82 | HR 93 | Ht 69.5 in | Wt 225.0 lb

## 2021-08-24 DIAGNOSIS — I2510 Atherosclerotic heart disease of native coronary artery without angina pectoris: Secondary | ICD-10-CM | POA: Diagnosis not present

## 2021-08-24 DIAGNOSIS — Z720 Tobacco use: Secondary | ICD-10-CM | POA: Diagnosis not present

## 2021-08-24 DIAGNOSIS — Z9861 Coronary angioplasty status: Secondary | ICD-10-CM | POA: Diagnosis not present

## 2021-08-24 DIAGNOSIS — E785 Hyperlipidemia, unspecified: Secondary | ICD-10-CM

## 2021-08-24 MED ORDER — VARENICLINE TARTRATE 1 MG PO TABS: 1.0000 mg | ORAL_TABLET | Freq: Two times a day (BID) | ORAL | 2 refills | Status: DC

## 2021-08-24 MED ORDER — ROSUVASTATIN CALCIUM 10 MG PO TABS: 10.0000 mg | ORAL_TABLET | Freq: Every day | ORAL | 3 refills | Status: DC

## 2021-08-24 NOTE — Patient Instructions (Signed)
Medication Instructions:  Stop Lipitor. Start Crestor 10 mg ( 1 Tablet Daily). Start Chantix 1 mg  Starter Pack. ( Take 1 Tablet Twice Daily). *If you need a refill on your cardiac medications before your next appointment, please call your pharmacy*   Lab Work: BMP,CBC,Hepatic Panel, Lipid Panel : Prior to Follow up Appointment. If you have labs (blood work) drawn today and your tests are completely normal, you will receive your results only by: MyChart Message (if you have MyChart) OR A paper copy in the mail If you have any lab test that is abnormal or we need to change your treatment, we will call you to review the results.   Testing/Procedures: No Testing   Follow-Up: At [facility], you and your health needs are our priority.  As part of our continuing mission to provide you with exceptional heart care, we have created designated Provider Care Teams.  These Care Teams include your primary Cardiologist (physician) and Advanced Practice Providers (APPs -  Physician Assistants and Nurse Practitioners) who all work together to provide you with the care you need, when you need it.  We recommend signing up for the patient portal called "MyChart".  Sign up information is provided on this After Visit Summary.  MyChart is used to connect with patients for Virtual Visits (Telemedicine).  Patients are able to view lab/test results, encounter notes, upcoming appointments, etc.  Non-urgent messages can be sent to your provider as well.   To learn more about what you can do with MyChart, go to [email or web address].    Your next appointment:   3 month(s)  The format for your next appointment:   In Person  Provider:   Lennie Odor, MD

## 2021-08-27 ENCOUNTER — Encounter: Payer: Self-pay | Admitting: *Deleted

## 2021-08-27 ENCOUNTER — Other Ambulatory Visit (HOSPITAL_BASED_OUTPATIENT_CLINIC_OR_DEPARTMENT_OTHER): Payer: Self-pay

## 2021-08-27 ENCOUNTER — Other Ambulatory Visit (HOSPITAL_COMMUNITY): Payer: Self-pay

## 2021-08-27 DIAGNOSIS — Z006 Encounter for examination for normal comparison and control in clinical research program: Secondary | ICD-10-CM

## 2021-08-27 NOTE — Research (Signed)
Follow-up call with patient for participating in V-Inception Clinical Trial.  Patient had questions and answers were given appropriately.  Discussion about compensation and relation of Inclisiran vs statin medications. Patient is undecided yet about enrollment.  Will follow-up again on 11/7.

## 2021-09-03 ENCOUNTER — Telehealth: Payer: Self-pay | Admitting: *Deleted

## 2021-09-03 DIAGNOSIS — Z006 Encounter for examination for normal comparison and control in clinical research program: Secondary | ICD-10-CM

## 2021-09-03 NOTE — Telephone Encounter (Signed)
Patients calling to see if forms were filled out yet. Please advise

## 2021-09-03 NOTE — Telephone Encounter (Signed)
Follow-up call about participating in v-inception research study, patient declines at this time

## 2021-09-03 NOTE — Telephone Encounter (Signed)
Attempted to call pt. Unable to leave VM.  Per Joni Reining, NP's note pt plans to return to work 2 weeks from office visit without restrictions, but would need intermittent FMLA in order to attend cardiac rehab.  Will route to nurse and FMLA specialist.

## 2021-09-10 NOTE — Telephone Encounter (Signed)
Patient came into office and picked up forms. Also had Dr.O'neal sign off on job duty form for employer.

## 2021-09-14 NOTE — Telephone Encounter (Signed)
Called patient to see if he was interested in participating in the Cardiac Rehab Program. Patient stated yes. Patient will come in for orientation on 10/11/21 @ 10AM and will attend the 3PM exercise class.   Pensions consultant.

## 2021-10-03 ENCOUNTER — Telehealth: Payer: Self-pay

## 2021-10-03 NOTE — Telephone Encounter (Signed)
**Note De-Identified Jaidin Richison Obfuscation** Brilinta PA started through covermymeds. Key: [id number]

## 2021-10-10 ENCOUNTER — Telehealth (HOSPITAL_COMMUNITY): Payer: Self-pay | Admitting: *Deleted

## 2021-10-10 NOTE — Telephone Encounter (Signed)
Called Kevin Lloyd to confirm his CR orientation appointment and to complete health history. He confirmed that he is coming. We discussed Covid precautions, proper shoes, wearing mask, directions to the department and our contact number. He voices understanding.

## 2021-10-11 ENCOUNTER — Encounter (HOSPITAL_COMMUNITY)
Admission: RE | Admit: 2021-10-11 | Discharge: 2021-10-11 | Disposition: A | Discharge status: Home / Self Care | Payer: 59 | Source: Ambulatory Visit | Attending: Cardiovascular Disease | Admitting: Cardiovascular Disease

## 2021-10-11 ENCOUNTER — Other Ambulatory Visit: Payer: Self-pay

## 2021-10-11 ENCOUNTER — Encounter (HOSPITAL_COMMUNITY): Payer: Self-pay

## 2021-10-11 VITALS — BP 104/60 | HR 63 | Ht 70.0 in | Wt 213.0 lb

## 2021-10-11 DIAGNOSIS — I214 Non-ST elevation (NSTEMI) myocardial infarction: Secondary | ICD-10-CM

## 2021-10-11 DIAGNOSIS — Z955 Presence of coronary angioplasty implant and graft: Secondary | ICD-10-CM | POA: Insufficient documentation

## 2021-10-11 DIAGNOSIS — I252 Old myocardial infarction: Secondary | ICD-10-CM | POA: Insufficient documentation

## 2021-10-11 DIAGNOSIS — Z48812 Encounter for surgical aftercare following surgery on the circulatory system: Secondary | ICD-10-CM | POA: Insufficient documentation

## 2021-10-11 HISTORY — DX: Hyperlipidemia, unspecified: E78.5

## 2021-10-11 HISTORY — DX: Atherosclerotic heart disease of native coronary artery without angina pectoris: I25.10

## 2021-10-11 NOTE — Progress Notes (Signed)
Cardiac Rehab Medication Review by a Nurse  Does the patient  feel that his/her medications are working for him/her?  yes  Has the patient been experiencing any side effects to the medications prescribed?  no  Does the patient measure his/her own blood pressure or blood glucose at home?  yes   Does the patient have any problems obtaining medications due to transportation or finances?   no  Understanding of regimen: excellent Understanding of indications: good Potential of compliance: good    Nurse comments: Kamdon is taking his medications as prescribed and has a good understanding of what his medications are for. Arkeem does not have BP cuff/ monitor to check his blood pressures at home.    Arta Bruce [facility] RN 10/11/2021 11:17 AM

## 2021-10-11 NOTE — Progress Notes (Signed)
Cardiac Individual Treatment Plan  Patient Details  Name: Kevin Lloyd MRN: [id number] Date of Birth: Apr 12, 1976 Referring Provider:   Flowsheet Row CARDIAC REHAB PHASE II ORIENTATION from 10/11/2021 in [place]  Referring Provider Dr. Eleonore Chiquito, MD       Initial Encounter Date:  [place] II ORIENTATION from 10/11/2021 in [place]  Date 10/11/21       Visit Diagnosis: 08/11/21 NSTEMI (non-ST elevated myocardial infarction) ([place])  08/13/21 S/P DES OM2  Patient's Home Medications on Admission:  Current Outpatient Medications:    Ascorbic Acid (VITAMIN C ADULT GUMMIES PO), Take 4 tablets by mouth every evening., Disp: , Rfl:    aspirin 81 MG EC tablet, Take 1 tablet (81 mg total) by mouth daily. Swallow whole., Disp: 90 tablet, Rfl: 3   lansoprazole (PREVACID) 15 MG capsule, Take 15 mg by mouth every evening., Disp: , Rfl:    metoprolol tartrate (LOPRESSOR) 25 MG tablet, Take 1 tablet (25 mg total) by mouth 2 (two) times daily., Disp: 60 tablet, Rfl: 11   nitroGLYCERIN (NITROSTAT) 0.4 MG SL tablet, Place 1 tablet (0.4 mg total) under the tongue every 5 (five) minutes x 3 doses as needed for chest pain., Disp: 25 tablet, Rfl: 1   rosuvastatin (CRESTOR) 10 MG tablet, Take 1 tablet (10 mg total) by mouth daily. (Patient taking differently: Take 10 mg by mouth every evening.), Disp: 90 tablet, Rfl: 3   tadalafil (CIALIS) 10 MG tablet, Take 1 tablet (10 mg total) by mouth daily as needed for erectile dysfunction. (Patient taking differently: Take 10 mg by mouth every evening.), Disp: 10 tablet, Rfl: 0   ticagrelor (BRILINTA) 90 MG TABS tablet, Take 1 tablet (90 mg total) by mouth 2 (two) times daily., Disp: 60 tablet, Rfl: 11   zinc gluconate 50 MG tablet, Take 50 mg by mouth every evening., Disp: , Rfl:   Past Medical History: Past Medical History:  Diagnosis Date   Acid reflux    Chest  pain 08/11/2021   Coronary artery disease    Hyperlipidemia    Kidney stone    NSTEMI (non-ST elevated myocardial infarction) ([place]) 08/11/2021    Tobacco Use: Social History   Tobacco Use  Smoking Status Former   Packs/day: 1.50   Types: Cigarettes   Quit date: 08/11/2021   Years since quitting: 0.1  Smokeless Tobacco Never    Labs: Recent Review Flowsheet Data     Labs for ITP Cardiac and Pulmonary Rehab Latest Ref Rng & Units 08/12/2021 08/13/2021   Cholestrol 0 - 200 mg/dL 206(H) -   LDLCALC 0 - 99 mg/dL 128(H) -   HDL >40 mg/dL 35(L) -   Trlycerides <150 mg/dL 215(H) -   Hemoglobin A1c 4.8 - 5.6 % - 6.2(H)       Capillary Blood Glucose: No results found for: GLUCAP   Exercise Target Goals: Exercise Program Goal: Individual exercise prescription set using results from initial 6 min walk test and THRR while considering  patients activity barriers and safety.   Exercise Prescription Goal: Starting with aerobic activity 30 plus minutes a day, 3 days per week for initial exercise prescription. Provide home exercise prescription and guidelines that participant acknowledges understanding prior to discharge.  Activity Barriers & Risk Stratification:  Activity Barriers & Cardiac Risk Stratification - 10/11/21 1206       Activity Barriers & Cardiac Risk Stratification   Activity Barriers None  Cardiac Risk Stratification Moderate   5.09 MET's on 6MWT            6 Minute Walk:  6 Minute Walk     Row Name 10/11/21 1201         6 Minute Walk   Phase Initial     Distance 1877 feet     Walk Time 6 minutes     # of Rest Breaks 0     MPH 3.55     METS 5.09     RPE 8     Perceived Dyspnea  0     VO2 Peak 17.82     Symptoms No     Resting HR 63 bpm     Resting BP 109/60     Resting Oxygen Saturation  100 %     Exercise Oxygen Saturation  during 6 min walk 99 %     Max Ex. HR 90 bpm     Max Ex. BP 124/74     2 Minute Post BP 118/76               Oxygen Initial Assessment:   Oxygen Re-Evaluation:   Oxygen Discharge (Final Oxygen Re-Evaluation):   Initial Exercise Prescription:  Initial Exercise Prescription - 10/11/21 1200       Date of Initial Exercise RX and Referring Provider   Date 10/11/21    Referring Provider Dr. Eleonore Chiquito, MD    Expected Discharge Date 12/07/21      Treadmill   MPH 3.2    Grade 1    Minutes 15    METs 3.89      Bike   Level 2    Minutes 15    METs 4.91      Prescription Details   Frequency (times per week) 3    Duration Progress to 30 minutes of continuous aerobic without signs/symptoms of physical distress      Intensity   THRR 40-80% of Max Heartrate 71-142    Ratings of Perceived Exertion 11-13    Perceived Dyspnea 0-4      Progression   Progression Continue progressive overload as per policy without signs/symptoms or physical distress.      Resistance Training   Training Prescription Yes    Weight 5    Reps 10-15             Perform Capillary Blood Glucose checks as needed.  Exercise Prescription Changes:   Exercise Comments:   Exercise Goals and Review:   Exercise Goals     Row Name 10/11/21 1211             Exercise Goals   Increase Physical Activity Yes       Intervention Provide advice, education, support and counseling about physical activity/exercise needs.;Develop an individualized exercise prescription for aerobic and resistive training based on initial evaluation findings, risk stratification, comorbidities and participant's personal goals.       Expected Outcomes Short Term: Attend rehab on a regular basis to increase amount of physical activity.;Long Term: Add in home exercise to make exercise part of routine and to increase amount of physical activity.;Long Term: Exercising regularly at least 3-5 days a week.       Increase Strength and Stamina Yes       Intervention Provide advice, education, support and counseling about physical  activity/exercise needs.;Develop an individualized exercise prescription for aerobic and resistive training based on initial evaluation findings, risk stratification, comorbidities and participant's personal goals.  Expected Outcomes Short Term: Increase workloads from initial exercise prescription for resistance, speed, and METs.;Short Term: Perform resistance training exercises routinely during rehab and add in resistance training at home;Long Term: Improve cardiorespiratory fitness, muscular endurance and strength as measured by increased METs and functional capacity (6MWT)       Able to understand and use rate of perceived exertion (RPE) scale Yes       Intervention Provide education and explanation on how to use RPE scale       Expected Outcomes Short Term: Able to use RPE daily in rehab to express subjective intensity level;Long Term:  Able to use RPE to guide intensity level when exercising independently       Knowledge and understanding of Target Heart Rate Range (THRR) Yes       Intervention Provide education and explanation of THRR including how the numbers were predicted and where they are located for reference       Expected Outcomes Short Term: Able to state/look up THRR;Long Term: Able to use THRR to govern intensity when exercising independently;Short Term: Able to use daily as guideline for intensity in rehab       Understanding of Exercise Prescription Yes       Intervention Provide education, explanation, and written materials on patient's individual exercise prescription       Expected Outcomes Short Term: Able to explain program exercise prescription;Long Term: Able to explain home exercise prescription to exercise independently                Exercise Goals Re-Evaluation :    Discharge Exercise Prescription (Final Exercise Prescription Changes):   Nutrition:  Target Goals: Understanding of nutrition guidelines, daily intake of sodium <1510m, cholesterol <2053m  calories 30% from fat and 7% or less from saturated fats, daily to have 5 or more servings of fruits and vegetables.  Biometrics:  Pre Biometrics - 10/11/21 1200       Pre Biometrics   Waist Circumference 39.5 inches    Hip Circumference 41.25 inches    Waist to Hip Ratio 0.96 %    Triceps Skinfold 16 mm    % Body Fat 27.5 %    Grip Strength 55 kg    Flexibility 15 in    Single Leg Stand 30 seconds              Nutrition Therapy Plan and Nutrition Goals:   Nutrition Assessments:  MEDIFICTS Score Key: ?70 Need to make dietary changes  40-70 Heart Healthy Diet ? 40 Therapeutic Level Cholesterol Diet   Picture Your Plate Scores: <4<27nhealthy dietary pattern with much room for improvement. 41-50 Dietary pattern unlikely to meet recommendations for good health and room for improvement. 51-60 More healthful dietary pattern, with some room for improvement.  >60 Healthy dietary pattern, although there may be some specific behaviors that could be improved.    Nutrition Goals Re-Evaluation:   Nutrition Goals Discharge (Final Nutrition Goals Re-Evaluation):   Psychosocial: Target Goals: Acknowledge presence or absence of significant depression and/or stress, maximize coping skills, provide positive support system. Participant is able to verbalize types and ability to use techniques and skills needed for reducing stress and depression.  Initial Review & Psychosocial Screening:  Initial Psych Review & Screening - 10/11/21 1137       Initial Review   Current issues with None Identified      Family Dynamics   Good Support System? Yes   AnDaidenas his girl friend and Mom  for support     Barriers   Psychosocial barriers to participate in program There are no identifiable barriers or psychosocial needs.      Screening Interventions   Interventions Encouraged to exercise             Quality of Life Scores:  Quality of Life - 10/11/21 1213       Quality of  Life   Select Quality of Life      Quality of Life Scores   Health/Function Pre 24.7 %    Socioeconomic Pre 21.38 %    Psych/Spiritual Pre 22.5 %    Family Pre 27 %    GLOBAL Pre 28.87 %            Scores of 19 and below usually indicate a poorer quality of life in these areas.  A difference of  2-3 points is a clinically meaningful difference.  A difference of 2-3 points in the total score of the Quality of Life Index has been associated with significant improvement in overall quality of life, self-image, physical symptoms, and general health in studies assessing change in quality of life.  PHQ-9: Recent Review Flowsheet Data     Depression screen [facility] 2/9 10/11/2021   Decreased Interest 0   Down, Depressed, Hopeless 0   PHQ - 2 Score 0      Interpretation of Total Score  Total Score Depression Severity:  1-4 = Minimal depression, 5-9 = Mild depression, 10-14 = Moderate depression, 15-19 = Moderately severe depression, 20-27 = Severe depression   Psychosocial Evaluation and Intervention:   Psychosocial Re-Evaluation:   Psychosocial Discharge (Final Psychosocial Re-Evaluation):   Vocational Rehabilitation: Provide vocational rehab assistance to qualifying candidates.   Vocational Rehab Evaluation & Intervention:  Vocational Rehab - 10/11/21 1456       Initial Vocational Rehab Evaluation & Intervention   Assessment shows need for Vocational Rehabilitation No   Treydon works full time and does not need vocational rehab at this time            Education: Education Goals: Education classes will be provided on a weekly basis, covering required topics. Participant will state understanding/return demonstration of topics presented.  Learning Barriers/Preferences:  Learning Barriers/Preferences - 10/11/21 1218       Learning Barriers/Preferences   Learning Barriers Sight   glasses/contacts   Learning Preferences Computer/Internet;Group Instruction;Individual  Instruction;Pictoral;Skilled Demonstration;Video;Written Material             Education Topics: Hypertension, Hypertension Reduction -Define heart disease and high blood pressure. Discus how high blood pressure affects the body and ways to reduce high blood pressure.   Exercise and Your Heart -Discuss why it is important to exercise, the FITT principles of exercise, normal and abnormal responses to exercise, and how to exercise safely.   Angina -Discuss definition of angina, causes of angina, treatment of angina, and how to decrease risk of having angina.   Cardiac Medications -Review what the following cardiac medications are used for, how they affect the body, and side effects that may occur when taking the medications.  Medications include Aspirin, Beta blockers, calcium channel blockers, ACE Inhibitors, angiotensin receptor blockers, diuretics, digoxin, and antihyperlipidemics.   Congestive Heart Failure -Discuss the definition of CHF, how to live with CHF, the signs and symptoms of CHF, and how keep track of weight and sodium intake.   Heart Disease and Intimacy -Discus the effect sexual activity has on the heart, how changes occur during intimacy as we age,  and safety during sexual activity.   Smoking Cessation / COPD -Discuss different methods to quit smoking, the health benefits of quitting smoking, and the definition of COPD.   Nutrition I: Fats -Discuss the types of cholesterol, what cholesterol does to the heart, and how cholesterol levels can be controlled.   Nutrition II: Labels -Discuss the different components of food labels and how to read food label   Heart Parts/Heart Disease and PAD -Discuss the anatomy of the heart, the pathway of blood circulation through the heart, and these are affected by heart disease.   Stress I: Signs and Symptoms -Discuss the causes of stress, how stress may lead to anxiety and depression, and ways to limit  stress.   Stress II: Relaxation -Discuss different types of relaxation techniques to limit stress.   Warning Signs of Stroke / TIA -Discuss definition of a stroke, what the signs and symptoms are of a stroke, and how to identify when someone is having stroke.   Knowledge Questionnaire Score:  Knowledge Questionnaire Score - 10/11/21 1219       Knowledge Questionnaire Score   Pre Score 24/24             Core Components/Risk Factors/Patient Goals at Admission:  Personal Goals and Risk Factors at Admission - 10/11/21 1221       Core Components/Risk Factors/Patient Goals on Admission    Weight Management Yes;Weight Loss;Obesity    Intervention Weight Management: Develop a combined nutrition and exercise program designed to reach desired caloric intake, while maintaining appropriate intake of nutrient and fiber, sodium and fats, and appropriate energy expenditure required for the weight goal.;Weight Management: Provide education and appropriate resources to help participant work on and attain dietary goals.;Weight Management/Obesity: Establish reasonable short term and long term weight goals.;Obesity: Provide education and appropriate resources to help participant work on and attain dietary goals.    Admit Weight 212 lb 12.8 oz (96.5 kg)    Expected Outcomes Short Term: Continue to assess and modify interventions until short term weight is achieved;Long Term: Adherence to nutrition and physical activity/exercise program aimed toward attainment of established weight goal;Weight Loss: Understanding of general recommendations for a balanced deficit meal plan, which promotes 1-2 lb weight loss per week and includes a negative energy balance of [id number] kcal/d;Understanding recommendations for meals to include 15-35% energy as protein, 25-35% energy from fat, 35-60% energy from carbohydrates, less than 237m of dietary cholesterol, 20-35 gm of total fiber daily;Understanding of distribution of  calorie intake throughout the day with the consumption of 4-5 meals/snacks    Hypertension Yes    Intervention Provide education on lifestyle modifcations including regular physical activity/exercise, weight management, moderate sodium restriction and increased consumption of fresh fruit, vegetables, and low fat dairy, alcohol moderation, and smoking cessation.;Monitor prescription use compliance.    Expected Outcomes Short Term: Continued assessment and intervention until BP is < 140/967mHG in hypertensive participants. < 130/8072mG in hypertensive participants with diabetes, heart failure or chronic kidney disease.;Long Term: Maintenance of blood pressure at goal levels.    Lipids Yes    Intervention Provide education and support for participant on nutrition & aerobic/resistive exercise along with prescribed medications to achieve LDL <29m33mDL >40mg17m Expected Outcomes Short Term: Participant states understanding of desired cholesterol values and is compliant with medications prescribed. Participant is following exercise prescription and nutrition guidelines.;Long Term: Cholesterol controlled with medications as prescribed, with individualized exercise RX and with personalized nutrition plan. Value goals: LDL < 29mg,59m  HDL > 40 mg.    Stress Yes    Intervention Offer individual and/or small group education and counseling on adjustment to heart disease, stress management and health-related lifestyle change. Teach and support self-help strategies.;Refer participants experiencing significant psychosocial distress to appropriate mental health specialists for further evaluation and treatment. When possible, include family members and significant others in education/counseling sessions.    Expected Outcomes Short Term: Participant demonstrates changes in health-related behavior, relaxation and other stress management skills, ability to obtain effective social support, and compliance with psychotropic  medications if prescribed.;Long Term: Emotional wellbeing is indicated by absence of clinically significant psychosocial distress or social isolation.    Personal Goal Other Yes    Personal Goal Short term: treat body well, Long term: jog/run possible, prevent another event, feel better than before, lose wt and maintain wt loss    Intervention Will offer diet handouts and helpful information. Will continue to monitor pt and progress workloads as tolerated without sign or symptom    Expected Outcomes Pt will achieve his goals             Core Components/Risk Factors/Patient Goals Review:    Core Components/Risk Factors/Patient Goals at Discharge (Final Review):    ITP Comments:  ITP Comments     Row Name 10/11/21 1135           ITP Comments Dr Fransico Him MD, Medical Director                Comments: Elberta Fortis attended orientation on 10/11/2021 to review rules and guidelines for program.  Completed 6 minute walk test, Intitial ITP, and exercise prescription.  VSS. Telemetry-Sinus Rhythm, tall t wave this has been previously document.  Asymptomatic. Safety measures and social distancing in place per CDC guidelines.Harrell Gave RN BSN

## 2021-10-15 ENCOUNTER — Other Ambulatory Visit: Payer: Self-pay

## 2021-10-15 ENCOUNTER — Encounter (HOSPITAL_COMMUNITY)
Admission: RE | Admit: 2021-10-15 | Discharge: 2021-10-15 | Disposition: A | Discharge status: Home / Self Care | Payer: 59 | Source: Ambulatory Visit | Attending: Cardiovascular Disease | Admitting: Cardiovascular Disease

## 2021-10-15 DIAGNOSIS — I214 Non-ST elevation (NSTEMI) myocardial infarction: Secondary | ICD-10-CM

## 2021-10-15 DIAGNOSIS — Z955 Presence of coronary angioplasty implant and graft: Secondary | ICD-10-CM | POA: Diagnosis not present

## 2021-10-15 DIAGNOSIS — Z48812 Encounter for surgical aftercare following surgery on the circulatory system: Secondary | ICD-10-CM | POA: Diagnosis not present

## 2021-10-15 DIAGNOSIS — I252 Old myocardial infarction: Secondary | ICD-10-CM | POA: Diagnosis not present

## 2021-10-15 NOTE — Progress Notes (Signed)
Daily Session Note  Patient Details  Name: Kevin Lloyd MRN: [id number] Date of Birth: 01-16-1976 Referring Provider:   Flowsheet Row CARDIAC REHAB PHASE II ORIENTATION from 10/11/2021 in [place]  Referring Provider Dr. Eleonore Chiquito, MD       Encounter Date: 10/15/2021  Check In:  Session Check In - 10/15/21 1522       Check-In   Supervising physician immediately available to respond to emergencies [facility] immediately available    Physician(s) Dr Maren Beach    Location [facility]    Staff Present Barnet Pall, RN, Deland Pretty, MS, ACSM CEP, Exercise Physiologist;David Lilyan Punt, MS, ACSM-CEP, CCRP, Exercise Physiologist    Virtual Visit No    Medication changes reported     No    Fall or balance concerns reported    No    Tobacco Cessation No Change    Warm-up and Cool-down Performed as group-led instruction    Resistance Training Performed Yes    VAD Patient? No    PAD/SET Patient? No      Pain Assessment   Currently in Pain? No/denies    Pain Score 0-No pain    Multiple Pain Sites No             Capillary Blood Glucose: No results found for this or any previous visit (from the past 24 hour(s)).   Exercise Prescription Changes - 10/15/21 1506       Response to Exercise   Blood Pressure (Admit) 114/80    Blood Pressure (Exercise) 148/84    Blood Pressure (Exit) 100/76    Heart Rate (Admit) 68 bpm    Heart Rate (Exercise) 121 bpm    Heart Rate (Exit) 78 bpm    Rating of Perceived Exertion (Exercise) 13    Symptoms None    Comments Off to a good start with exercise.    Duration Continue with 30 min of aerobic exercise without signs/symptoms of physical distress.    Intensity THRR unchanged      Progression   Progression Continue to progress workloads to maintain intensity without signs/symptoms of physical distress.    Average METs 4.4      Resistance Training   Training Prescription  Yes    Weight 5    Reps 10-15    Time 10 Minutes      Interval Training   Interval Training No      Treadmill   MPH 3.2    Grade 1    Minutes 15    METs 3.89      Bike   Level 2    Minutes 15    METs 4.91             Social History   Tobacco Use  Smoking Status Former   Packs/day: 1.50   Types: Cigarettes   Quit date: 08/11/2021   Years since quitting: 0.1  Smokeless Tobacco Never    Goals Met:  Exercise tolerated well No report of concerns or symptoms today Strength training completed today  Goals Unmet:  Not Applicable  Comments: Kevin Lloyd started cardiac rehab today.  Pt tolerated light exercise without difficulty. VSS, telemetry-Sinus Rhythm, asymptomatic.  Medication list reconciled. Pt denies barriers to medicaiton compliance.  PSYCHOSOCIAL ASSESSMENT:  PHQ-0. Pt exhibits positive coping skills, hopeful outlook with supportive family. No psychosocial needs identified at this time, no psychosocial interventions necessary.    Pt enjoys hiking and camping.   Pt oriented to exercise equipment  and routine.    Understanding verbalized.Barnet Pall, RN,BSN 10/16/2021 2:27 PM     Dr. Fransico Him is Medical Director for Cardiac Rehab at [facility].

## 2021-10-16 NOTE — Progress Notes (Signed)
Cardiac Individual Treatment Plan  Patient Details  Name: Kevin Lloyd MRN: [id number] Date of Birth: Apr 12, 1976 Referring Provider:   Flowsheet Row CARDIAC REHAB PHASE II ORIENTATION from 10/11/2021 in [place]  Referring Provider Dr. Eleonore Chiquito, MD       Initial Encounter Date:  [place] II ORIENTATION from 10/11/2021 in [place]  Date 10/11/21       Visit Diagnosis: 08/11/21 NSTEMI (non-ST elevated myocardial infarction) ([place])  08/13/21 S/P DES OM2  Patient's Home Medications on Admission:  Current Outpatient Medications:    Ascorbic Acid (VITAMIN C ADULT GUMMIES PO), Take 4 tablets by mouth every evening., Disp: , Rfl:    aspirin 81 MG EC tablet, Take 1 tablet (81 mg total) by mouth daily. Swallow whole., Disp: 90 tablet, Rfl: 3   lansoprazole (PREVACID) 15 MG capsule, Take 15 mg by mouth every evening., Disp: , Rfl:    metoprolol tartrate (LOPRESSOR) 25 MG tablet, Take 1 tablet (25 mg total) by mouth 2 (two) times daily., Disp: 60 tablet, Rfl: 11   nitroGLYCERIN (NITROSTAT) 0.4 MG SL tablet, Place 1 tablet (0.4 mg total) under the tongue every 5 (five) minutes x 3 doses as needed for chest pain., Disp: 25 tablet, Rfl: 1   rosuvastatin (CRESTOR) 10 MG tablet, Take 1 tablet (10 mg total) by mouth daily. (Patient taking differently: Take 10 mg by mouth every evening.), Disp: 90 tablet, Rfl: 3   tadalafil (CIALIS) 10 MG tablet, Take 1 tablet (10 mg total) by mouth daily as needed for erectile dysfunction. (Patient taking differently: Take 10 mg by mouth every evening.), Disp: 10 tablet, Rfl: 0   ticagrelor (BRILINTA) 90 MG TABS tablet, Take 1 tablet (90 mg total) by mouth 2 (two) times daily., Disp: 60 tablet, Rfl: 11   zinc gluconate 50 MG tablet, Take 50 mg by mouth every evening., Disp: , Rfl:   Past Medical History: Past Medical History:  Diagnosis Date   Acid reflux    Chest  pain 08/11/2021   Coronary artery disease    Hyperlipidemia    Kidney stone    NSTEMI (non-ST elevated myocardial infarction) ([place]) 08/11/2021    Tobacco Use: Social History   Tobacco Use  Smoking Status Former   Packs/day: 1.50   Types: Cigarettes   Quit date: 08/11/2021   Years since quitting: 0.1  Smokeless Tobacco Never    Labs: Recent Review Flowsheet Data     Labs for ITP Cardiac and Pulmonary Rehab Latest Ref Rng & Units 08/12/2021 08/13/2021   Cholestrol 0 - 200 mg/dL 206(H) -   LDLCALC 0 - 99 mg/dL 128(H) -   HDL >40 mg/dL 35(L) -   Trlycerides <150 mg/dL 215(H) -   Hemoglobin A1c 4.8 - 5.6 % - 6.2(H)       Capillary Blood Glucose: No results found for: GLUCAP   Exercise Target Goals: Exercise Program Goal: Individual exercise prescription set using results from initial 6 min walk test and THRR while considering  patients activity barriers and safety.   Exercise Prescription Goal: Starting with aerobic activity 30 plus minutes a day, 3 days per week for initial exercise prescription. Provide home exercise prescription and guidelines that participant acknowledges understanding prior to discharge.  Activity Barriers & Risk Stratification:  Activity Barriers & Cardiac Risk Stratification - 10/11/21 1206       Activity Barriers & Cardiac Risk Stratification   Activity Barriers None  Cardiac Risk Stratification Moderate   5.09 MET's on 6MWT            6 Minute Walk:  6 Minute Walk     Row Name 10/11/21 1201         6 Minute Walk   Phase Initial     Distance 1877 feet     Walk Time 6 minutes     # of Rest Breaks 0     MPH 3.55     METS 5.09     RPE 8     Perceived Dyspnea  0     VO2 Peak 17.82     Symptoms No     Resting HR 63 bpm     Resting BP 109/60     Resting Oxygen Saturation  100 %     Exercise Oxygen Saturation  during 6 min walk 99 %     Max Ex. HR 90 bpm     Max Ex. BP 124/74     2 Minute Post BP 118/76               Oxygen Initial Assessment:   Oxygen Re-Evaluation:   Oxygen Discharge (Final Oxygen Re-Evaluation):   Initial Exercise Prescription:  Initial Exercise Prescription - 10/11/21 1200       Date of Initial Exercise RX and Referring Provider   Date 10/11/21    Referring Provider Dr. Eleonore Chiquito, MD    Expected Discharge Date 12/07/21      Treadmill   MPH 3.2    Grade 1    Minutes 15    METs 3.89      Bike   Level 2    Minutes 15    METs 4.91      Prescription Details   Frequency (times per week) 3    Duration Progress to 30 minutes of continuous aerobic without signs/symptoms of physical distress      Intensity   THRR 40-80% of Max Heartrate 71-142    Ratings of Perceived Exertion 11-13    Perceived Dyspnea 0-4      Progression   Progression Continue progressive overload as per policy without signs/symptoms or physical distress.      Resistance Training   Training Prescription Yes    Weight 5    Reps 10-15             Perform Capillary Blood Glucose checks as needed.  Exercise Prescription Changes:   Exercise Prescription Changes     Row Name 10/15/21 1506             Response to Exercise   Blood Pressure (Admit) 114/80       Blood Pressure (Exercise) 148/84       Blood Pressure (Exit) 100/76       Heart Rate (Admit) 68 bpm       Heart Rate (Exercise) 121 bpm       Heart Rate (Exit) 78 bpm       Rating of Perceived Exertion (Exercise) 13       Symptoms None       Comments Off to a good start with exercise.       Duration Continue with 30 min of aerobic exercise without signs/symptoms of physical distress.       Intensity THRR unchanged         Progression   Progression Continue to progress workloads to maintain intensity without signs/symptoms of physical distress.  Average METs 4.4         Resistance Training   Training Prescription Yes       Weight 5       Reps 10-15       Time 10 Minutes         Interval Training    Interval Training No         Treadmill   MPH 3.2       Grade 1       Minutes 15       METs 3.89         Bike   Level 2       Minutes 15       METs 4.91                Exercise Comments:   Exercise Comments     Row Name 10/15/21 1636           Exercise Comments Patient tolerated first session of exercise well without symptoms.                Exercise Goals and Review:   Exercise Goals     Row Name 10/11/21 1211             Exercise Goals   Increase Physical Activity Yes       Intervention Provide advice, education, support and counseling about physical activity/exercise needs.;Develop an individualized exercise prescription for aerobic and resistive training based on initial evaluation findings, risk stratification, comorbidities and participant's personal goals.       Expected Outcomes Short Term: Attend rehab on a regular basis to increase amount of physical activity.;Long Term: Add in home exercise to make exercise part of routine and to increase amount of physical activity.;Long Term: Exercising regularly at least 3-5 days a week.       Increase Strength and Stamina Yes       Intervention Provide advice, education, support and counseling about physical activity/exercise needs.;Develop an individualized exercise prescription for aerobic and resistive training based on initial evaluation findings, risk stratification, comorbidities and participant's personal goals.       Expected Outcomes Short Term: Increase workloads from initial exercise prescription for resistance, speed, and METs.;Short Term: Perform resistance training exercises routinely during rehab and add in resistance training at home;Long Term: Improve cardiorespiratory fitness, muscular endurance and strength as measured by increased METs and functional capacity (6MWT)       Able to understand and use rate of perceived exertion (RPE) scale Yes       Intervention Provide education and explanation on  how to use RPE scale       Expected Outcomes Short Term: Able to use RPE daily in rehab to express subjective intensity level;Long Term:  Able to use RPE to guide intensity level when exercising independently       Knowledge and understanding of Target Heart Rate Range (THRR) Yes       Intervention Provide education and explanation of THRR including how the numbers were predicted and where they are located for reference       Expected Outcomes Short Term: Able to state/look up THRR;Long Term: Able to use THRR to govern intensity when exercising independently;Short Term: Able to use daily as guideline for intensity in rehab       Understanding of Exercise Prescription Yes       Intervention Provide education, explanation, and written materials on patient's individual exercise prescription  Expected Outcomes Short Term: Able to explain program exercise prescription;Long Term: Able to explain home exercise prescription to exercise independently                Exercise Goals Re-Evaluation :  Exercise Goals Re-Evaluation     Row Name 10/15/21 1636             Exercise Goal Re-Evaluation   Exercise Goals Review Increase Physical Activity;Able to understand and use rate of perceived exertion (RPE) scale       Comments Patient able to understand and use RPE scale appropriately.       Expected Outcomes Progress workloads as tolerated to help achieve personal health and fitness goals.                 Discharge Exercise Prescription (Final Exercise Prescription Changes):  Exercise Prescription Changes - 10/15/21 1506       Response to Exercise   Blood Pressure (Admit) 114/80    Blood Pressure (Exercise) 148/84    Blood Pressure (Exit) 100/76    Heart Rate (Admit) 68 bpm    Heart Rate (Exercise) 121 bpm    Heart Rate (Exit) 78 bpm    Rating of Perceived Exertion (Exercise) 13    Symptoms None    Comments Off to a good start with exercise.    Duration Continue with 30 min of  aerobic exercise without signs/symptoms of physical distress.    Intensity THRR unchanged      Progression   Progression Continue to progress workloads to maintain intensity without signs/symptoms of physical distress.    Average METs 4.4      Resistance Training   Training Prescription Yes    Weight 5    Reps 10-15    Time 10 Minutes      Interval Training   Interval Training No      Treadmill   MPH 3.2    Grade 1    Minutes 15    METs 3.89      Bike   Level 2    Minutes 15    METs 4.91             Nutrition:  Target Goals: Understanding of nutrition guidelines, daily intake of sodium <1555m, cholesterol <2091m calories 30% from fat and 7% or less from saturated fats, daily to have 5 or more servings of fruits and vegetables.  Biometrics:  Pre Biometrics - 10/11/21 1200       Pre Biometrics   Waist Circumference 39.5 inches    Hip Circumference 41.25 inches    Waist to Hip Ratio 0.96 %    Triceps Skinfold 16 mm    % Body Fat 27.5 %    Grip Strength 55 kg    Flexibility 15 in    Single Leg Stand 30 seconds              Nutrition Therapy Plan and Nutrition Goals:   Nutrition Assessments:  MEDIFICTS Score Key: ?70 Need to make dietary changes  40-70 Heart Healthy Diet ? 40 Therapeutic Level Cholesterol Diet   Picture Your Plate Scores: <4<93nhealthy dietary pattern with much room for improvement. 41-50 Dietary pattern unlikely to meet recommendations for good health and room for improvement. 51-60 More healthful dietary pattern, with some room for improvement.  >60 Healthy dietary pattern, although there may be some specific behaviors that could be improved.    Nutrition Goals Re-Evaluation:   Nutrition Goals Discharge (Final Nutrition Goals Re-Evaluation):  Psychosocial: Target Goals: Acknowledge presence or absence of significant depression and/or stress, maximize coping skills, provide positive support system. Participant is able  to verbalize types and ability to use techniques and skills needed for reducing stress and depression.  Initial Review & Psychosocial Screening:  Initial Psych Review & Screening - 10/11/21 1137       Initial Review   Current issues with None Identified      Family Dynamics   Good Support System? Yes   Fares has his girl friend and Mom for support     Barriers   Psychosocial barriers to participate in program There are no identifiable barriers or psychosocial needs.      Screening Interventions   Interventions Encouraged to exercise             Quality of Life Scores:  Quality of Life - 10/11/21 1213       Quality of Life   Select Quality of Life      Quality of Life Scores   Health/Function Pre 24.7 %    Socioeconomic Pre 21.38 %    Psych/Spiritual Pre 22.5 %    Family Pre 27 %    GLOBAL Pre 28.87 %            Scores of 19 and below usually indicate a poorer quality of life in these areas.  A difference of  2-3 points is a clinically meaningful difference.  A difference of 2-3 points in the total score of the Quality of Life Index has been associated with significant improvement in overall quality of life, self-image, physical symptoms, and general health in studies assessing change in quality of life.  PHQ-9: Recent Review Flowsheet Data     Depression screen [facility] 2/9 10/11/2021   Decreased Interest 0   Down, Depressed, Hopeless 0   PHQ - 2 Score 0      Interpretation of Total Score  Total Score Depression Severity:  1-4 = Minimal depression, 5-9 = Mild depression, 10-14 = Moderate depression, 15-19 = Moderately severe depression, 20-27 = Severe depression   Psychosocial Evaluation and Intervention:   Psychosocial Re-Evaluation:  Psychosocial Re-Evaluation     [place] Name 10/16/21 1429             Psychosocial Re-Evaluation   Current issues with None Identified       Interventions Encouraged to attend Cardiac Rehabilitation for the exercise        Continue Psychosocial Services  No Follow up required                Psychosocial Discharge (Final Psychosocial Re-Evaluation):  Psychosocial Re-Evaluation - 10/16/21 1429       Psychosocial Re-Evaluation   Current issues with None Identified    Interventions Encouraged to attend Cardiac Rehabilitation for the exercise    Continue Psychosocial Services  No Follow up required             Vocational Rehabilitation: Provide vocational rehab assistance to qualifying candidates.   Vocational Rehab Evaluation & Intervention:  Vocational Rehab - 10/11/21 1456       Initial Vocational Rehab Evaluation & Intervention   Assessment shows need for Vocational Rehabilitation No   Conlin works full time and does not need vocational rehab at this time            Education: Education Goals: Education classes will be provided on a weekly basis, covering required topics. Participant will state understanding/return demonstration of topics presented.  Learning Barriers/Preferences:  Learning Barriers/Preferences - 10/11/21 1218       Learning Barriers/Preferences   Learning Barriers Sight   glasses/contacts   Learning Preferences Computer/Internet;Group Instruction;Individual Instruction;Pictoral;Skilled Demonstration;Video;Written Material             Education Topics: Hypertension, Hypertension Reduction -Define heart disease and high blood pressure. Discus how high blood pressure affects the body and ways to reduce high blood pressure.   Exercise and Your Heart -Discuss why it is important to exercise, the FITT principles of exercise, normal and abnormal responses to exercise, and how to exercise safely.   Angina -Discuss definition of angina, causes of angina, treatment of angina, and how to decrease risk of having angina.   Cardiac Medications -Review what the following cardiac medications are used for, how they affect the body, and side effects that may  occur when taking the medications.  Medications include Aspirin, Beta blockers, calcium channel blockers, ACE Inhibitors, angiotensin receptor blockers, diuretics, digoxin, and antihyperlipidemics.   Congestive Heart Failure -Discuss the definition of CHF, how to live with CHF, the signs and symptoms of CHF, and how keep track of weight and sodium intake.   Heart Disease and Intimacy -Discus the effect sexual activity has on the heart, how changes occur during intimacy as we age, and safety during sexual activity.   Smoking Cessation / COPD -Discuss different methods to quit smoking, the health benefits of quitting smoking, and the definition of COPD.   Nutrition I: Fats -Discuss the types of cholesterol, what cholesterol does to the heart, and how cholesterol levels can be controlled.   Nutrition II: Labels -Discuss the different components of food labels and how to read food label   Heart Parts/Heart Disease and PAD -Discuss the anatomy of the heart, the pathway of blood circulation through the heart, and these are affected by heart disease.   Stress I: Signs and Symptoms -Discuss the causes of stress, how stress may lead to anxiety and depression, and ways to limit stress.   Stress II: Relaxation -Discuss different types of relaxation techniques to limit stress.   Warning Signs of Stroke / TIA -Discuss definition of a stroke, what the signs and symptoms are of a stroke, and how to identify when someone is having stroke.   Knowledge Questionnaire Score:  Knowledge Questionnaire Score - 10/11/21 1219       Knowledge Questionnaire Score   Pre Score 24/24             Core Components/Risk Factors/Patient Goals at Admission:  Personal Goals and Risk Factors at Admission - 10/11/21 1221       Core Components/Risk Factors/Patient Goals on Admission    Weight Management Yes;Weight Loss;Obesity    Intervention Weight Management: Develop a combined nutrition and  exercise program designed to reach desired caloric intake, while maintaining appropriate intake of nutrient and fiber, sodium and fats, and appropriate energy expenditure required for the weight goal.;Weight Management: Provide education and appropriate resources to help participant work on and attain dietary goals.;Weight Management/Obesity: Establish reasonable short term and long term weight goals.;Obesity: Provide education and appropriate resources to help participant work on and attain dietary goals.    Admit Weight 212 lb 12.8 oz (96.5 kg)    Expected Outcomes Short Term: Continue to assess and modify interventions until short term weight is achieved;Long Term: Adherence to nutrition and physical activity/exercise program aimed toward attainment of established weight goal;Weight Loss: Understanding of general recommendations for a balanced deficit meal plan, which promotes 1-2 lb weight  loss per week and includes a negative energy balance of [phone] kcal/d;Understanding recommendations for meals to include 15-35% energy as protein, 25-35% energy from fat, 35-60% energy from carbohydrates, less than 219m of dietary cholesterol, 20-35 gm of total fiber daily;Understanding of distribution of calorie intake throughout the day with the consumption of 4-5 meals/snacks    Hypertension Yes    Intervention Provide education on lifestyle modifcations including regular physical activity/exercise, weight management, moderate sodium restriction and increased consumption of fresh fruit, vegetables, and low fat dairy, alcohol moderation, and smoking cessation.;Monitor prescription use compliance.    Expected Outcomes Short Term: Continued assessment and intervention until BP is < 140/918mHG in hypertensive participants. < 130/809mG in hypertensive participants with diabetes, heart failure or chronic kidney disease.;Long Term: Maintenance of blood pressure at goal levels.    Lipids Yes    Intervention Provide  education and support for participant on nutrition & aerobic/resistive exercise along with prescribed medications to achieve LDL <3m49mDL >40mg62m Expected Outcomes Short Term: Participant states understanding of desired cholesterol values and is compliant with medications prescribed. Participant is following exercise prescription and nutrition guidelines.;Long Term: Cholesterol controlled with medications as prescribed, with individualized exercise RX and with personalized nutrition plan. Value goals: LDL < 3mg,44m > 40 mg.    Stress Yes    Intervention Offer individual and/or small group education and counseling on adjustment to heart disease, stress management and health-related lifestyle change. Teach and support self-help strategies.;Refer participants experiencing significant psychosocial distress to appropriate mental health specialists for further evaluation and treatment. When possible, include family members and significant others in education/counseling sessions.    Expected Outcomes Short Term: Participant demonstrates changes in health-related behavior, relaxation and other stress management skills, ability to obtain effective social support, and compliance with psychotropic medications if prescribed.;Long Term: Emotional wellbeing is indicated by absence of clinically significant psychosocial distress or social isolation.    Personal Goal Other Yes    Personal Goal Short term: treat body well, Long term: jog/run possible, prevent another event, feel better than before, lose wt and maintain wt loss    Intervention Will offer diet handouts and helpful information. Will continue to monitor pt and progress workloads as tolerated without sign or symptom    Expected Outcomes Pt will achieve his goals             Core Components/Risk Factors/Patient Goals Review:    Core Components/Risk Factors/Patient Goals at Discharge (Final Review):    ITP Comments:  ITP Comments     Row Name  10/11/21 1135 10/16/21 1427         ITP Comments Dr Traci Fransico Himedical Director 30 Day ITP Review. AnthonNicholused exercise cardiac rehab on 10/15/21 and did well with exercise               Comments: See ITP comments.Harshal Sirmon Barnet PallSN 10/16/2021 2:33 PM

## 2021-10-17 ENCOUNTER — Other Ambulatory Visit: Payer: Self-pay

## 2021-10-17 ENCOUNTER — Encounter (HOSPITAL_COMMUNITY)
Admission: RE | Admit: 2021-10-17 | Discharge: 2021-10-17 | Disposition: A | Discharge status: Home / Self Care | Payer: 59 | Source: Ambulatory Visit | Attending: Cardiovascular Disease | Admitting: Cardiovascular Disease

## 2021-10-17 ENCOUNTER — Other Ambulatory Visit: Payer: Self-pay | Admitting: Cardiovascular Disease

## 2021-10-17 DIAGNOSIS — Z955 Presence of coronary angioplasty implant and graft: Secondary | ICD-10-CM

## 2021-10-17 DIAGNOSIS — I214 Non-ST elevation (NSTEMI) myocardial infarction: Secondary | ICD-10-CM

## 2021-10-17 DIAGNOSIS — Z48812 Encounter for surgical aftercare following surgery on the circulatory system: Secondary | ICD-10-CM | POA: Diagnosis not present

## 2021-10-17 NOTE — Telephone Encounter (Signed)
*  STAT* If patient is at the pharmacy, call can be transferred to refill team.   1. Which medications need to be refilled? (please list name of each medication and dose if known)  tadalafil (CIALIS) 10 MG tablet  2. Which pharmacy/location (including street and city if local pharmacy) is medication to be sent to? [facility] - [address] [place], [place], [place] [address]  3. Do they need a 30 day or 90 day supply?  30 day supply  Patient states this medication is normally ordered by his PCP, but he is out of town and unable to send in Rx. He states he is completely out of medication.

## 2021-10-18 MED ORDER — TADALAFIL 10 MG PO TABS: 10.0000 mg | ORAL_TABLET | Freq: Every day | ORAL | 0 refills | Status: DC | PRN

## 2021-10-19 ENCOUNTER — Encounter (HOSPITAL_COMMUNITY): Payer: 59

## 2021-10-24 ENCOUNTER — Telehealth (HOSPITAL_COMMUNITY): Payer: Self-pay

## 2021-10-24 ENCOUNTER — Encounter (HOSPITAL_COMMUNITY): Payer: 59

## 2021-10-26 ENCOUNTER — Encounter (HOSPITAL_COMMUNITY): Payer: 59

## 2021-10-26 ENCOUNTER — Telehealth (HOSPITAL_COMMUNITY): Payer: Self-pay

## 2021-10-31 ENCOUNTER — Encounter (HOSPITAL_COMMUNITY): Payer: 59

## 2021-11-02 ENCOUNTER — Encounter (HOSPITAL_COMMUNITY): Payer: 59

## 2021-11-05 ENCOUNTER — Encounter (HOSPITAL_COMMUNITY): Payer: 59

## 2021-11-06 ENCOUNTER — Telehealth (HOSPITAL_COMMUNITY): Payer: Self-pay | Admitting: *Deleted

## 2021-11-06 NOTE — Telephone Encounter (Signed)
Left message to call cardiac rehab. Armin's last day of participation was on 10/17/21. Gladstone Lighter, RN,BSN 11/06/2021 11:00 AM

## 2021-11-07 ENCOUNTER — Encounter (HOSPITAL_COMMUNITY): Payer: 59

## 2021-11-09 ENCOUNTER — Encounter (HOSPITAL_COMMUNITY): Payer: 59

## 2021-11-09 ENCOUNTER — Telehealth (HOSPITAL_COMMUNITY): Payer: Self-pay | Admitting: *Deleted

## 2021-11-09 NOTE — Addendum Note (Signed)
Encounter addended by: Esmeralda Links S on: 11/09/2021 4:36 PM  Actions taken: Flowsheet accepted

## 2021-11-09 NOTE — Telephone Encounter (Signed)
Spoke with the patient will discharge from the program. Will discharge from cardiac rehab at this time. Kevin Lloyd attended 2 exercise sessions on 10/15/21 and 10/17/21.Barnet Pall, RN,BSN 11/09/2021 4:53 PM

## 2021-11-12 ENCOUNTER — Encounter (HOSPITAL_COMMUNITY): Payer: 59

## 2021-11-12 ENCOUNTER — Telehealth: Payer: Self-pay

## 2021-11-12 NOTE — Telephone Encounter (Signed)
**Note De-Identified Starsky Nanna Obfuscation** Fax received from [place] stating that the pts plan does not cover Brilinta. Preferred alternative is Clopidogrel.  Will forward this message to Dr Audie Box and Jory Sims, NP who saw the pt at his last f/u on 08/24/2021 for advisement.

## 2021-11-13 NOTE — Telephone Encounter (Signed)
**Note De-Identified Seira Cody Obfuscation** Sande Rives, MD  You; Jodelle Gross, NP 19 hours ago (2:44 PM)   Switch to plavix 75 mg daily. Give several refills.    Gerri Spore T. Flora Lipps, MD, [facility]   [facility]  [address], [address]  [place], [place] [address]  [phone]  2:43 PM

## 2021-11-13 NOTE — Telephone Encounter (Signed)
**Note De-Identified Rochell Mabie Obfuscation** I called the pt to make him aware of this change from Brilinta to generic Clopdogrel due to his ins coverage per [facility] pharmacy.  He stated that [facility] called him yesterday and advised him that one of his refills required a PA and that they called him back later in the day and said that all of his refills are ready for pick up including his Brilinta.  I called [facility] and was advised by the pharmacist that the fax we received from them was in error as the pts ins plan is covering Brilinta but that the pt has a deductible to meet so his co-pay for Marden Noble is $39.57 even with his Brilinta co-pay card until he meets his deductible and that Clopidogrel will cost him $0 because it is generic.  I called the pt back and advised him of the above and he stated that he will pay that amount to stay on Brilinta as he is doing well on it. He states that if he decides to switch to Clopidogrel later he will call us back.  He did thank me for my assistance.

## 2021-11-14 ENCOUNTER — Encounter (HOSPITAL_COMMUNITY): Payer: 59

## 2021-11-16 ENCOUNTER — Encounter (HOSPITAL_COMMUNITY): Payer: 59

## 2021-11-19 ENCOUNTER — Encounter (HOSPITAL_COMMUNITY): Payer: 59

## 2021-11-21 ENCOUNTER — Encounter (HOSPITAL_COMMUNITY): Payer: 59

## 2021-11-23 ENCOUNTER — Encounter (HOSPITAL_COMMUNITY): Payer: 59

## 2021-11-26 ENCOUNTER — Encounter (HOSPITAL_COMMUNITY): Payer: 59

## 2021-11-28 ENCOUNTER — Encounter (HOSPITAL_COMMUNITY): Payer: 59

## 2021-11-30 ENCOUNTER — Encounter (HOSPITAL_COMMUNITY): Payer: 59

## 2021-12-03 ENCOUNTER — Encounter (HOSPITAL_COMMUNITY): Payer: 59

## 2021-12-05 ENCOUNTER — Encounter (HOSPITAL_COMMUNITY): Payer: 59

## 2021-12-07 ENCOUNTER — Encounter (HOSPITAL_COMMUNITY): Payer: 59

## 2021-12-10 ENCOUNTER — Ambulatory Visit: Payer: 59 | Admitting: Cardiovascular Disease

## 2021-12-11 ENCOUNTER — Telehealth: Payer: Self-pay

## 2021-12-11 NOTE — Telephone Encounter (Signed)
**Note De-Identified Kalid Ghan Obfuscation** Brilinta PA started through covermymeds. Key: [id number]

## 2021-12-12 NOTE — Telephone Encounter (Signed)
**Note De-Identified Makinzee Durley Obfuscation** Letter received form [employer]:  12/12/2021 Kevin Lloyd [address] [place], [place] [address] Plan Member ID: [id number] Case Number: [id number] Prescriber Name: Riley Lam Prescriber Fax: [phone]  Dear Kevin Lloyd: On behalf of [facility] [employer], [employer] is responsible for reviewing pharmacy services provided to [facility] [employer] members.  On 12/11/2021, we received a request from your prescriber for coverage of Brilinta Tab 90mg . This request has been cancelled. Why was my request cancelled? This medication currently does not require clinical review, and you may pick this up from your pharmacy. This medication or product is on your plan's list of covered drugs. Prior authorization is not required at this time.  If your pharmacy has questions regarding the processing of your prescription, please have them call the [employer] pharmacy help desk at [phone]. To find out more information about this request and why it was cancelled, please call [employer] [facility] toll-free at [phone]. Sincerely,             [employer] cc: [phone]  I have notified [facility] [phone] - , [place] - [address] AT [facility] [address] of this approval.

## 2021-12-21 ENCOUNTER — Ambulatory Visit: Payer: 59 | Admitting: Cardiovascular Disease

## 2022-01-28 ENCOUNTER — Ambulatory Visit: Payer: 59 | Admitting: Nurse Practitioner

## 2022-01-28 NOTE — Progress Notes (Deleted)
? ? ?Office Visit  ?  ?Patient Name: Kevin Lloyd ?Date of Encounter: 01/28/2022 ? ?Primary Care Provider:  Pcp, No ?Primary Cardiologist:  Evalina Field, MD ? ?Chief Complaint  ?  ?46 year old male with a history of CAD, hyperlipidemia, and tobacco use who presents for follow-up related to CAD. ? ?Past Medical History  ?  ?Past Medical History:  ?Diagnosis Date  ? Acid reflux   ? Chest pain 08/11/2021  ? Coronary artery disease   ? Hyperlipidemia   ? Kidney stone   ? NSTEMI (non-ST elevated myocardial infarction) ([place]) 08/11/2021  ? ?Past Surgical History:  ?Procedure Laterality Date  ? CARDIAC CATHETERIZATION    ? CORONARY STENT INTERVENTION N/A 08/13/2021  ? Procedure: CORONARY STENT INTERVENTION;  Surgeon: Sherren Mocha, MD;  Location: [facility];  Service: Cardiovascular;  Laterality: N/A;  ? LEFT HEART CATH AND CORONARY ANGIOGRAPHY N/A 08/13/2021  ? Procedure: LEFT HEART CATH AND CORONARY ANGIOGRAPHY;  Surgeon: Sherren Mocha, MD;  Location: [facility];  Service: Cardiovascular;  Laterality: N/A;  ? WISDOM TOOTH EXTRACTION    ? WISDOM TOOTH EXTRACTION    ? ? ?Allergies ? ?Allergies  ?Allergen Reactions  ? Azithromycin Other (See Comments)  ?  Caused his tongue to breakout with bumps  ? ? ?History of Present Illness  ?  ?46 year old male with a history of CAD, hyperlipidemia, and tobacco use. ? ?He was hospitalized in October 2022 in the setting of NSTEMI.  Cardiac catheterization showed  OM2 95-0% s/p DES, 1st RPL 100%, RPDA 70%.  Medical therapy was recommended for residual coronary artery disease.  Echocardiogram at the time showed EF 50 to 55%, no RWMA, G1 DD, no significant valvular abnormalities.  He was last seen in the office on 08/24/2021 and was stable overall from a cardiac standpoint.  He was started on Chantix for smoking cessation.  Incision from Brilinta to Plavix due to cost.  LDL was 128 at the time.  Atorvastatin was reduced and subsequently switched to rosuvastatin 10 mg  daily in the setting of arthralgias/myalgias.  Repeat lipids were ordered but never completed.   ? ?He presents today for follow-up.  Since his last visit ? ?CAD: Cath in October 2022 showed OM2 95-0% s/p DES, 1st RPL 100%, RPDA 70%.  Medical therapy recommended for residual CAD.  Echo showed EF 50 to 55%, no RWMA, G1 DD, no significant valvular abnormalities. ?Hyperlipidemia: LDL was 128 in October 2022.  He is due for repeat lipids, LFTs. ?Tobacco use: Full cessation advised. ?Disposition: ? ?Home Medications  ?  ?Current Outpatient Medications  ?Medication Sig Dispense Refill  ? Ascorbic Acid (VITAMIN C ADULT GUMMIES PO) Take 4 tablets by mouth every evening.    ? aspirin 81 MG EC tablet Take 1 tablet (81 mg total) by mouth daily. Swallow whole. 90 tablet 3  ? lansoprazole (PREVACID) 15 MG capsule Take 15 mg by mouth every evening.    ? metoprolol tartrate (LOPRESSOR) 25 MG tablet Take 1 tablet (25 mg total) by mouth 2 (two) times daily. 60 tablet 11  ? nitroGLYCERIN (NITROSTAT) 0.4 MG SL tablet Place 1 tablet (0.4 mg total) under the tongue every 5 (five) minutes x 3 doses as needed for chest pain. 25 tablet 1  ? rosuvastatin (CRESTOR) 10 MG tablet Take 1 tablet (10 mg total) by mouth daily. (Patient taking differently: Take 10 mg by mouth every evening.) 90 tablet 3  ? tadalafil (CIALIS) 10 MG tablet Take 1 tablet (10  mg total) by mouth daily as needed for erectile dysfunction. 10 tablet 0  ? ticagrelor (BRILINTA) 90 MG TABS tablet Take 1 tablet (90 mg total) by mouth 2 (two) times daily. 60 tablet 11  ? zinc gluconate 50 MG tablet Take 50 mg by mouth every evening.    ? ?No current facility-administered medications for this visit.  ?  ? ?Review of Systems  ?  ?***.  All other systems reviewed and are otherwise negative except as noted above. ?  ? ?Physical Exam  ?  ?VS:  There were no vitals taken for this visit. , BMI There is no height or weight on file to calculate BMI. ?    ?GEN: Well nourished, well  developed, in no acute distress. ?HEENT: normal. ?Neck: Supple, no JVD, carotid bruits, or masses. ?Cardiac: RRR, no murmurs, rubs, or gallops. No clubbing, cyanosis, edema.  Radials/DP/PT 2+ and equal bilaterally.  ?Respiratory:  Respirations regular and unlabored, clear to auscultation bilaterally. ?GI: Soft, nontender, nondistended, BS + x 4. ?MS: no deformity or atrophy. ?Skin: warm and dry, no rash. ?Neuro:  Strength and sensation are intact. ?Psych: Normal affect. ? ?Accessory Clinical Findings  ?  ?ECG personally reviewed by me today - *** - no acute changes. ? ?Lab Results  ?Component Value Date  ? WBC 11.1 (H) 08/14/2021  ? HGB 16.2 08/14/2021  ? HCT 47.6 08/14/2021  ? MCV 89.0 08/14/2021  ? PLT 212 08/14/2021  ? ?Lab Results  ?Component Value Date  ? CREATININE 0.86 08/14/2021  ? BUN 13 08/14/2021  ? NA 138 08/14/2021  ? K 4.5 08/14/2021  ? CL 106 08/14/2021  ? CO2 26 08/14/2021  ? ?Lab Results  ?Component Value Date  ? ALT 24 12/09/2020  ? AST 21 12/09/2020  ? ALKPHOS 85 12/09/2020  ? BILITOT 1.1 12/09/2020  ? ?Lab Results  ?Component Value Date  ? CHOL 206 (H) 08/12/2021  ? HDL 35 (L) 08/12/2021  ? LDLCALC 128 (H) 08/12/2021  ? TRIG 215 (H) 08/12/2021  ? CHOLHDL 5.9 08/12/2021  ?  ?Lab Results  ?Component Value Date  ? HGBA1C 6.2 (H) 08/13/2021  ? ? ?Assessment & Plan  ?  ?1.  *** ? ? ?Lenna Sciara, NP ?01/28/2022, 1:07 PM ?  ? ?

## 2022-02-04 ENCOUNTER — Encounter: Payer: Self-pay | Admitting: Nurse Practitioner

## 2022-02-15 ENCOUNTER — Telehealth: Payer: Self-pay | Admitting: Cardiovascular Disease

## 2022-02-15 NOTE — Telephone Encounter (Signed)
?*  STAT* If patient is at the pharmacy, call can be transferred to refill team.   1. Which medications need to be refilled? (please list name of each medication and dose if known) rosuvastatin (CRESTOR) 10 MG tablet  2. Which pharmacy/location (including street and city if local pharmacy) is medication to be sent to? [id number] - [place], [place] - [address]  3. Do they need a 30 day or 90 day supply? 90 day supply   

## 2022-02-18 ENCOUNTER — Other Ambulatory Visit: Payer: Self-pay

## 2022-02-18 MED ORDER — ROSUVASTATIN CALCIUM 10 MG PO TABS: 10.0000 mg | ORAL_TABLET | Freq: Every day | ORAL | 3 refills | Status: DC

## 2022-02-18 NOTE — Telephone Encounter (Signed)
Called patient to advise medication refill sent ot pharmacy. Left message. ?

## 2022-02-19 ENCOUNTER — Telehealth: Payer: Self-pay

## 2022-02-19 MED ORDER — CLOPIDOGREL BISULFATE 75 MG PO TABS: 75.0000 mg | ORAL_TABLET | Freq: Every day | ORAL | 3 refills | Status: DC

## 2022-02-19 NOTE — Telephone Encounter (Signed)
Left detailed message on name-verified VM with med change info. Rx(s) sent to pharmacy electronically. ? ?

## 2022-02-19 NOTE — Telephone Encounter (Signed)
Geralynn Rile, MD  Via, Deliah Boston, LPN ?Cc: Caprice Beaver, LPN; P Cv Div Nl Triage ?Caller: Unspecified (Today,  2:39 PM) ?Switch to plavix 75 mg daily.  ? ?[place] T. Audie Box, MD, [facility]  ?[place]  ?[address]  ?[place], [place] [address]  ?[phone]  ?2:54 PM  ?

## 2022-02-19 NOTE — Telephone Encounter (Signed)
**Note De-Identified Izmael Duross Obfuscation** Fax received from [facility] stating that the pts ins plan does not cover Brilinta and that their preferred alternative is Clopidogrel. ? ?Will forward to Dr Audie Box and his nurse for advisement to the pt. ?

## 2022-02-19 NOTE — Telephone Encounter (Signed)
Kevin Rile, MD  Fidel Levy, RN ?Caller: Unspecified (Today,  2:39 PM) ?No loading dose. He is >6 months from ACS.  ? ?[place] T. Audie Box, MD, [facility]  ?[place]  ?[address]  ?[place], [place] [address]  ?[phone]  ?3:01 PM  ?

## 2022-03-02 ENCOUNTER — Other Ambulatory Visit: Payer: Self-pay

## 2022-03-02 ENCOUNTER — Emergency Department (HOSPITAL_BASED_OUTPATIENT_CLINIC_OR_DEPARTMENT_OTHER): Payer: 59

## 2022-03-02 ENCOUNTER — Encounter (HOSPITAL_BASED_OUTPATIENT_CLINIC_OR_DEPARTMENT_OTHER): Payer: Self-pay | Admitting: Emergency Medicine

## 2022-03-02 ENCOUNTER — Emergency Department (HOSPITAL_BASED_OUTPATIENT_CLINIC_OR_DEPARTMENT_OTHER)
Admission: EM | Admit: 2022-03-02 | Discharge: 2022-03-02 | Disposition: A | Discharge status: Home / Self Care | Payer: 59 | Attending: Emergency Medicine | Admitting: Emergency Medicine

## 2022-03-02 DIAGNOSIS — I10 Essential (primary) hypertension: Secondary | ICD-10-CM | POA: Insufficient documentation

## 2022-03-02 DIAGNOSIS — Z7982 Long term (current) use of aspirin: Secondary | ICD-10-CM | POA: Insufficient documentation

## 2022-03-02 DIAGNOSIS — I2510 Atherosclerotic heart disease of native coronary artery without angina pectoris: Secondary | ICD-10-CM | POA: Diagnosis not present

## 2022-03-02 DIAGNOSIS — Z7902 Long term (current) use of antithrombotics/antiplatelets: Secondary | ICD-10-CM | POA: Diagnosis not present

## 2022-03-02 DIAGNOSIS — R42 Dizziness and giddiness: Secondary | ICD-10-CM | POA: Insufficient documentation

## 2022-03-02 DIAGNOSIS — Z955 Presence of coronary angioplasty implant and graft: Secondary | ICD-10-CM | POA: Insufficient documentation

## 2022-03-02 LAB — COMPREHENSIVE METABOLIC PANEL
ALT: 21 U/L (ref 0–44)
AST: 16 U/L (ref 15–41)
Albumin: 4.3 g/dL (ref 3.5–5.0)
Alkaline Phosphatase: 105 U/L (ref 38–126)
Anion gap: 7 (ref 5–15)
BUN: 16 mg/dL (ref 6–20)
CO2: 26 mmol/L (ref 22–32)
Calcium: 9.3 mg/dL (ref 8.9–10.3)
Chloride: 108 mmol/L (ref 98–111)
Creatinine, Ser: 0.85 mg/dL (ref 0.61–1.24)
GFR, Estimated: >60 mL/min (ref >60)
Glucose, Bld: 131 mg/dL — ABNORMAL HIGH (ref 70–99)
Potassium: 4.6 mmol/L (ref 3.5–5.1)
Sodium: 141 mmol/L (ref 135–145)
Total Bilirubin: 0.3 mg/dL (ref 0.3–1.2)
Total Protein: 7.6 g/dL (ref 6.5–8.1)

## 2022-03-02 LAB — CBC
HCT: 47.8 % (ref 39.0–52.0)
Hemoglobin: 16.4 g/dL (ref 13.0–17.0)
MCH: 29.9 pg (ref 26.0–34.0)
MCHC: 34.3 g/dL (ref 30.0–36.0)
MCV: 87.1 fL (ref 80.0–100.0)
Platelets: 215 10*3/uL (ref 150–400)
RBC: 5.49 MIL/uL (ref 4.22–5.81)
RDW: 12.6 % (ref 11.5–15.5)
WBC: 9.3 10*3/uL (ref 4.0–10.5)
nRBC: 0 % (ref 0.0–0.2)

## 2022-03-02 MED ORDER — IOHEXOL 350 MG/ML SOLN
100.0000 mL | Freq: Once | INTRAVENOUS | Status: AC | PRN
  Administered 2022-03-02: 75 mL via INTRAVENOUS

## 2022-03-02 NOTE — Discharge Instructions (Addendum)
You are seen in the ER for dizziness.  It is unclear as to what drove your symptoms.  You are feeling better right now, which is reassuring ? ?We recommend that he keep an eye on your symptoms.  If you start having repeat dizziness along with change in vision, severe headache or neck pain, one-sided weakness, numbness or slurred speech -call 911 immediately. ? ?Otherwise, follow-up with your primary care doctor in 1 week. ?We have provided neurology contact information in case your primary care doctor is not available. ?

## 2022-03-02 NOTE — ED Provider Notes (Signed)
?[facility]ENC[facility] DEPARTMENT ?Provider Note ? ? ?CSN: [id number] ?Arrival date & time: 03/02/22  2800 ? ?  ? ?History ? ?Chief Complaint  ?Patient presents with  ? Dizziness  ? ? ?Kevin Lloyd is a 46 y.o. male. ? ?HPI ? ?  ? ?46 year old male comes in with chief complaint of dizziness.  ?Patient has history of CAD.  He indicates that he started having " dizziness" when he woke up this morning.  Last known normal at 10 PM when he went to bed.  Dizziness is described as " things jumping in front of me" when the patient was looking at the Alexa clock.  For the next 10 to 15 seconds, as he ambulated he had to hold onto things.  Thereafter the symptoms started improving.  When he drove to work, he felt better.  However at once at work, he had repeat sensation. ? ?When specifically asked about spinning sensation or near fainting, patient declines.  When we asked him about his vision when he was driving and now, patient states that he was able to see things clearly.  He indicates that he just feels a little foggy, like he has to shake his head to concentrate.  It felt to him that his eyes were just flickering. ? ?Patient denies any headache, neck pain.  He denies any new medications.  No history of similar symptoms in the past.  While in the ED, he feels a lot better. ? ?Review of system is negative for nausea, vision loss, focal numbness or weakness on one side, slurred speech. ? ?Home Medications ?Prior to Admission medications   ?Medication Sig Start Date End Date Taking? Authorizing Provider  ?aspirin 81 MG EC tablet Take 1 tablet (81 mg total) by mouth daily. Swallow whole. 08/14/21  Yes Chandrasekhar, Mahesh A, MD  ?metoprolol tartrate (LOPRESSOR) 25 MG tablet Take 1 tablet (25 mg total) by mouth 2 (two) times daily. 08/14/21  Yes Chandrasekhar, Mahesh A, MD  ?rosuvastatin (CRESTOR) 10 MG tablet Take 1 tablet (10 mg total) by mouth daily. 02/18/22 05/19/22 Yes O'Neal, Ronnald Ramp, MD  ?ticagrelor  (BRILINTA) 90 MG TABS tablet Take by mouth 2 (two) times daily.   Yes [provider]  ?Ascorbic Acid (VITAMIN C ADULT GUMMIES PO) Take 4 tablets by mouth every evening.    [provider]  ?clopidogrel (PLAVIX) 75 MG tablet Take 1 tablet (75 mg total) by mouth daily. 02/19/22   O'NealRonnald Ramp, MD  ?lansoprazole (PREVACID) 15 MG capsule Take 15 mg by mouth every evening.    [provider]  ?nitroGLYCERIN (NITROSTAT) 0.4 MG SL tablet Place 1 tablet (0.4 mg total) under the tongue every 5 (five) minutes x 3 doses as needed for chest pain. 08/14/21   Christell Constant, MD  ?tadalafil (CIALIS) 10 MG tablet Take 1 tablet (10 mg total) by mouth daily as needed for erectile dysfunction. 10/18/21   O'NealRonnald Ramp, MD  ?zinc gluconate 50 MG tablet Take 50 mg by mouth every evening.    [provider]  ?   ? ?Allergies    ?Azithromycin   ? ?Review of Systems   ?Review of Systems  ?All other systems reviewed and are negative. ? ?Physical Exam ?Updated Vital Signs ?BP (!) 138/101   Pulse (!) 56   Temp 98 ?F (36.7 ?C) (Oral)   Resp 20   SpO2 100%  ?Physical Exam ?Vitals and nursing note reviewed.  ?Constitutional:   ?   Appearance:  He is well-developed.  ?HENT:  ?   Head: Atraumatic.  ?Eyes:  ?   Extraocular Movements: Extraocular movements intact.  ?   Pupils: Pupils are equal, round, and reactive to light.  ?   Comments: No nystagmus, no visual field cuts, finger counting at the bedside normal  ?Cardiovascular:  ?   Rate and Rhythm: Normal rate.  ?Pulmonary:  ?   Effort: Pulmonary effort is normal.  ?Musculoskeletal:  ?   Cervical back: Neck supple.  ?Skin: ?   General: Skin is warm.  ?Neurological:  ?   Mental Status: He is alert and oriented to person, place, and time.  ?   Cranial Nerves: No cranial nerve deficit.  ?   Sensory: No sensory deficit.  ?   Motor: No weakness.  ?   Coordination: Coordination normal.  ?   Gait: Gait normal.  ?   Comments: Patient  ambulated, able to walk without staggering, cerebellar exam revealed no dysmetria.  ? ? ?ED Results / Procedures / Treatments   ?Labs ?(all labs ordered are listed, but only abnormal results are displayed) ?Labs Reviewed  ?COMPREHENSIVE METABOLIC PANEL - Abnormal; Notable for the following components:  ?    Result Value  ? Glucose, Bld 131 (*)   ? All other components within normal limits  ?CBC  ? ? ?EKG ?EKG Interpretation ? ?Date/Time:  Saturday Mar 02 2022 07:34:28 EDT ?Ventricular Rate:  72 ?PR Interval:  166 ?QRS Duration: 94 ?QT Interval:  372 ?QTC Calculation: 408 ?R Axis:   -23 ?Text Interpretation: Sinus rhythm Borderline left axis deviation Abnormal R-wave progression, early transition No acute changes No significant change since last tracing Confirmed by Derwood KaplanNanavati, Kalista Laguardia [phone]([id number]) on 03/02/2022 8:51:45 AM ? ?Radiology ?CT ANGIO HEAD NECK W WO CM ? ?Result Date: 03/02/2022 ?CLINICAL DATA:  46 year old male with dizziness. Room spinning. EXAM: CT ANGIOGRAPHY HEAD AND NECK TECHNIQUE: Multidetector CT imaging of the head and neck was performed using the standard protocol during bolus administration of intravenous contrast. Multiplanar CT image reconstructions and MIPs were obtained to evaluate the vascular anatomy. Carotid stenosis measurements (when applicable) are obtained utilizing NASCET criteria, using the distal internal carotid diameter as the denominator. RADIATION DOSE REDUCTION: This exam was performed according to the departmental dose-optimization program which includes automated exposure control, adjustment of the mA and/or kV according to patient size and/or use of iterative reconstruction technique. CONTRAST:  75mL OMNIPAQUE IOHEXOL 350 MG/ML SOLN COMPARISON:  [facility] head CT 07/18/2009 (no images available). Cervical spine CT 10/14/2015. FINDINGS: CT HEAD Brain: Cerebral volume is within normal limits. No midline shift, ventriculomegaly, mass  effect, evidence of mass lesion, intracranial hemorrhage or evidence of cortically based acute infarction. Gray-white matter differentiation is within normal limits throughout the brain. Calvarium and skull base: Negative. Paranasal sinuses: Mild bilateral ethmoid mucosal thickening. Other visualized paranasal sinuses and mastoids are clear. Tympanic cavities are clear. Orbits: Rightward gaze deviation. Visualized orbits and scalp soft tissues are within normal limits. CTA NECK Skeleton: No acute osseous abnormality identified. Mild for age cervical spine degeneration. Upper chest: Negative. Other neck: Negative. Aortic arch: 3 vessel arch configuration. No arch atherosclerosis. Right carotid system: Negative. Left carotid system: Negative. Vertebral arteries: Proximal right subclavian artery and right vertebral artery origin are normal. Right vertebral artery appears somewhat non dominant but patent to the skull base with no plaque or stenosis identified. Proximal left subclavian artery and left vertebral artery origin are normal.  Dominant left vertebral artery appears patent and normal to the skull base. CTA HEAD Posterior circulation: Dominant left vertebral V4 segment. Normal PICA origins. No distal vertebral or vertebrobasilar junction stenosis. Patent basilar artery without stenosis. Mildly ectatic basilar tip but no discrete saccular aneurysm. SCA and PCA origins are normal. Posterior communicating arteries are diminutive or absent. Bilateral PCA branches are within normal limits, right P1 and P2 appear somewhat diminutive compared to the left but there is no focal stenosis (series 14, image 57). Anterior circulation: Both ICA siphons are patent with minimal calcified plaque and no siphon stenosis. Normal ophthalmic artery origins. Patent carotid termini, MCA and ACA origins. Left ACA A1 appears mildly dominant. Anterior communicating artery and bilateral ACA branches are within normal limits. Left MCA M1  segment and bifurcation are patent without stenosis. Right MCA M1 segment and bifurcation are patent without stenosis. Bilateral MCA branches are within normal limits. Venous sinuses: Patent. Anatomic variants: Dominant le

## 2022-03-02 NOTE — ED Notes (Signed)
ED Provider at bedside. 

## 2022-03-02 NOTE — ED Triage Notes (Signed)
Pt reports he woke up this am with light headedness/dizziness. Describes more as sensation of room spinning rather than feeling like he is going to loose consciousness. Got better before he got to work but then returned. Denies any other symptoms. ?

## 2022-03-21 NOTE — Progress Notes (Signed)
Cardiology Office Note:   Date:  03/22/2022  NAME:  Kevin Lloyd    MRN: [id number] DOB:  1976-02-28   PCP:  Salli RealSun, Yun, MD  Cardiologist:  Reatha HarpsWesley T O'Neal, MD  Electrophysiologist:  None   Referring MD: No ref. provider found   Chief Complaint  Patient presents with   Follow-up   History of Present Illness:   Kevin Lloyd is a 46 y.o. male with a hx of CAD, HLD, HTN, tobacco abuse who presents for follow-up.  Reports he is doing well.  Denies any chest pain or trouble breathing.  Still using vape products.  He wants a prescription for Chantix.  Needs repeat lipids checked.  Not fasting today.  No structured exercise but does activity at work.  No chest pain or trouble breathing.  Currently on aspirin and Brilinta.  Apparently the cost was not an issue.  Plan to complete 1 year of therapy.  Cardiovascular examination is normal.  Denies any major symptoms in office.  Blood pressure is well controlled  Problem List CAD/NSTEMI -07/2021 -95% OM2 -> PCI -100% RPLV -> Med management -70% RPDA -> Med management  2. HLD 3. HTN 4. Tobacco abuse   Past Medical History: Past Medical History:  Diagnosis Date   Acid reflux    Chest pain 08/11/2021   Coronary artery disease    Hyperlipidemia    Kidney stone    NSTEMI (non-ST elevated myocardial infarction) (HCC) 08/11/2021    Past Surgical History: Past Surgical History:  Procedure Laterality Date   CARDIAC CATHETERIZATION     CORONARY STENT INTERVENTION N/A 08/13/2021   Procedure: CORONARY STENT INTERVENTION;  Surgeon: Tonny Bollmanooper, Michael, MD;  [facility];  Service: Cardiovascular;  Laterality: N/A;   LEFT HEART CATH AND CORONARY ANGIOGRAPHY N/A 08/13/2021   Procedure: LEFT HEART CATH AND CORONARY ANGIOGRAPHY;  Surgeon: Tonny Bollmanooper, Michael, MD;  [facility];  Service: Cardiovascular;  Laterality: N/A;   WISDOM TOOTH EXTRACTION     WISDOM TOOTH EXTRACTION      Current Medications: Current Meds   Medication Sig   Ascorbic Acid (VITAMIN C ADULT GUMMIES PO) Take 4 tablets by mouth every evening.   aspirin 81 MG EC tablet Take 1 tablet (81 mg total) by mouth daily. Swallow whole.   lansoprazole (PREVACID) 15 MG capsule Take 15 mg by mouth every evening.   metoprolol tartrate (LOPRESSOR) 25 MG tablet Take 1 tablet (25 mg total) by mouth 2 (two) times daily.   rosuvastatin (CRESTOR) 10 MG tablet Take 1 tablet (10 mg total) by mouth daily.   tadalafil (CIALIS) 10 MG tablet Take 1 tablet (10 mg total) by mouth daily as needed for erectile dysfunction.   ticagrelor (BRILINTA) 90 MG TABS tablet Take by mouth 2 (two) times daily.   varenicline (CHANTIX) 0.5 MG tablet Take 1 tablet (0.5 mg total) by mouth 2 (two) times daily.   zinc gluconate 50 MG tablet Take 50 mg by mouth every evening.   [DISCONTINUED] clopidogrel (PLAVIX) 75 MG tablet Take 1 tablet (75 mg total) by mouth daily.   [DISCONTINUED] nitroGLYCERIN (NITROSTAT) 0.4 MG SL tablet Place 1 tablet (0.4 mg total) under the tongue every 5 (five) minutes x 3 doses as needed for chest pain.     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  Tobacco Use   Smoking status: Former    Packs/day: 1.50    Types: Cigarettes    Quit date: 08/11/2021    Years since quitting: 0.6   Smokeless tobacco: Never  Vaping Use   Vaping Use: Never used  Substance and Sexual Activity   Alcohol use: Yes    Comment: rare   Drug use: No   Sexual activity: Yes  Other Topics Concern   Not on file  Social History Narrative   Not on file   Social Determinants of Health   Financial Resource Strain: Not on file  Food Insecurity: Not on file  Transportation Needs: Not on file  Physical Activity: Not on file   Stress: Not on file  Social Connections: Not on file     Family History: The patient's family history includes Diabetes in an other family member; Heart attack in his father and paternal grandfather.  ROS:   All other ROS reviewed and negative. Pertinent positives noted in the HPI.     EKGs/Labs/Other Studies Reviewed:   The following studies were personally reviewed by me today:   TTE 08/13/2021  1. Left ventricular ejection fraction, by estimation, is 50 to 55%. The  left ventricle has low normal function. The left ventricle has no regional  wall motion abnormalities. Left ventricular diastolic parameters are  consistent with Grade I diastolic  dysfunction (impaired relaxation).   2. Right ventricular systolic function is normal. The right ventricular  size is normal.   3. The mitral valve is normal in structure. No evidence of mitral valve  regurgitation. No evidence of mitral stenosis.   4. The aortic valve is normal in structure. Aortic valve regurgitation is  not visualized. No aortic stenosis is present.   5. The inferior vena cava is normal in size with greater than 50%  respiratory variability, suggesting right atrial pressure of 3 mmHg.   LHC  08/13/2021 1.  Severe stenosis of the second obtuse marginal branch of the circumflex, treated successfully with PCI using a 2.5 x 15 mm resolute Onyx DES 2.  Patent left main with no significant stenosis 3.  Patent LAD with mild diffuse plaquing but no significant stenosis 4.  Patent RCA with mild diffuse irregularity and ectasia, followed by moderately severe stenosis of the distal PDA branch suitable for medical therapy  Recent Labs: 08/13/2021: TSH 3.340 03/02/2022: ALT 21; BUN 16; Creatinine, Ser 0.85; Hemoglobin 16.4; Platelets 215; Potassium 4.6; Sodium 141   Recent Lipid Panel    Component Value Date/Time   CHOL 206 (H) 08/12/2021 0300   TRIG 215 (H) 08/12/2021 0300   HDL 35 (L) 08/12/2021 0300   CHOLHDL 5.9  08/12/2021 0300   VLDL 43 (H) 08/12/2021 0300   LDLCALC 128 (H) 08/12/2021 0300    Physical Exam:   VS:  BP 126/68   Pulse 68   Ht 5' 9.5" (1.765 m)   Wt 221 lb 9.6 oz (100.5 kg)   SpO2 99%   BMI 32.26 kg/m    Wt Readings from Last 3 Encounters:  03/22/22 221 lb 9.6 oz (100.5 kg)  10/11/21 212 lb 15.4 oz (96.6 kg)  08/24/21 225 lb (102.1 kg)    General: Well nourished, well developed, in no acute distress Head: Atraumatic, normal size  Eyes: PEERLA, EOMI  Neck: Supple, no JVD Endocrine: No thryomegaly Cardiac: Normal S1, S2;  RRR; no murmurs, rubs, or gallops Lungs: Clear to auscultation bilaterally, no wheezing, rhonchi or rales  Abd: Soft, nontender, no hepatomegaly  Ext: No edema, pulses 2+ Musculoskeletal: No deformities, BUE and BLE strength normal and equal Skin: Warm and dry, no rashes   Neuro: Alert and oriented to person, place, time, and situation, CNII-XII grossly intact, no focal deficits  Psych: Normal mood and affect   ASSESSMENT:   Kevin Lloyd is a 46 y.o. male who presents for the following: 1. Coronary artery disease of native artery of native heart with stable angina pectoris (HCC)   2. Mixed hyperlipidemia   3. Primary hypertension   4. Tobacco abuse     PLAN:   1. Coronary artery disease of native artery of native heart with stable angina pectoris (HCC) 2. Mixed hyperlipidemia -Non-STEMI October 2022.  Underwent PCI to an obtuse marginal branch.  Has residual disease in the RPL V as well as RPDA.  This has been managed medically.  Denies any symptoms of angina or chest pain.  We will complete 1 year of aspirin and Brilinta.  He is currently on Crestor 10 mg daily.  Need recheck on labs.  We will do this next week as he is not fasting.  Blood pressure is well controlled.  Currently on metoprolol.  No symptoms of angina or trouble breathing.  Echo was normal.  He will see me back in 6 months we will stop his Brilinta.  We will just recheck his lipids to  make sure cholesterol dose is appropriate.  3. Primary hypertension -Well-controlled.  4. Tobacco abuse -Still using vape products.  We will prescribe Chantix.       Disposition: Return in about 6 months (around 09/22/2022).  Medication Adjustments/Labs and Tests Ordered: Current medicines are reviewed at length with the patient today.  Concerns regarding medicines are outlined above.  Orders Placed This Encounter  Procedures   Lipid panel   Meds ordered this encounter  Medications   varenicline (CHANTIX) 0.5 MG tablet    Sig: Take 1 tablet (0.5 mg total) by mouth 2 (two) times daily.    Dispense:  180 tablet    Refill:  1   nitroGLYCERIN (NITROSTAT) 0.4 MG SL tablet    Sig: Place 1 tablet (0.4 mg total) under the tongue every 5 (five) minutes x 3 doses as needed for chest pain.    Dispense:  25 tablet    Refill:  1    Patient Instructions  Medication Instructions:  START Chantix 0.5 mg twice daily   *If you need a refill on your cardiac medications before your next appointment, please call your pharmacy*   Lab Work: LIPID (next week, come back fasting nothing to eat or drink, no lab appointment needed)  If you have labs (blood work) drawn today and your tests are completely normal, you will receive your results only by: MyChart Message (if you have MyChart) OR A paper copy in the mail If you have any lab test that is abnormal or we need to change your treatment, we will call you to review the results.   Follow-Up: At [facility], you and your health needs are our priority.  As part of our continuing mission to provide you with exceptional heart care, we have created designated Provider Care Teams.  These Care Teams include your primary Cardiologist (physician) and Advanced Practice Providers (APPs -  Physician Assistants and Nurse Practitioners) who all work together to provide you with the care you  need, when you need it.  We recommend signing up for the  patient portal called "MyChart".  Sign up information is provided on this After Visit Summary.  MyChart is used to connect with patients for Virtual Visits (Telemedicine).  Patients are able to view lab/test results, encounter notes, upcoming appointments, etc.  Non-urgent messages can be sent to your provider as well.   To learn more about what you can do with MyChart, go to [email or web address].    Your next appointment:   6 month(s)  The format for your next appointment:   In Person  Provider:   Reatha Harps, MD {          Time Spent with Patient: I have spent a total of 35 minutes with patient reviewing hospital notes, telemetry, EKGs, labs and examining the patient as well as establishing an assessment and plan that was discussed with the patient.  > 50% of time was spent in direct patient care.  Signed, Lenna Gilford. Flora Lipps, MD, [facility]  [facility]  [address] [place], [place] [address] [phone]  03/22/2022 9:56 AM

## 2022-03-22 ENCOUNTER — Ambulatory Visit: Payer: 59 | Admitting: Cardiovascular Disease

## 2022-03-22 ENCOUNTER — Encounter: Payer: Self-pay | Admitting: Cardiovascular Disease

## 2022-03-22 VITALS — BP 126/68 | HR 68 | Ht 69.5 in | Wt 221.6 lb

## 2022-03-22 DIAGNOSIS — I25118 Atherosclerotic heart disease of native coronary artery with other forms of angina pectoris: Secondary | ICD-10-CM | POA: Diagnosis not present

## 2022-03-22 DIAGNOSIS — I10 Essential (primary) hypertension: Secondary | ICD-10-CM

## 2022-03-22 DIAGNOSIS — Z720 Tobacco use: Secondary | ICD-10-CM

## 2022-03-22 DIAGNOSIS — E782 Mixed hyperlipidemia: Secondary | ICD-10-CM | POA: Diagnosis not present

## 2022-03-22 MED ORDER — VARENICLINE TARTRATE 0.5 MG PO TABS: 0.5000 mg | ORAL_TABLET | Freq: Two times a day (BID) | ORAL | 1 refills | Status: DC

## 2022-03-22 MED ORDER — NITROGLYCERIN 0.4 MG SL SUBL: 0.4000 mg | SUBLINGUAL_TABLET | SUBLINGUAL | 1 refills | Status: DC | PRN

## 2022-03-22 NOTE — Patient Instructions (Signed)
Medication Instructions:  START Chantix 0.5 mg twice daily   *If you need a refill on your cardiac medications before your next appointment, please call your pharmacy*   Lab Work: LIPID (next week, come back fasting nothing to eat or drink, no lab appointment needed)  If you have labs (blood work) drawn today and your tests are completely normal, you will receive your results only by: MyChart Message (if you have MyChart) OR A paper copy in the mail If you have any lab test that is abnormal or we need to change your treatment, we will call you to review the results.   Follow-Up: At [facility], you and your health needs are our priority.  As part of our continuing mission to provide you with exceptional heart care, we have created designated Provider Care Teams.  These Care Teams include your primary Cardiologist (physician) and Advanced Practice Providers (APPs -  Physician Assistants and Nurse Practitioners) who all work together to provide you with the care you need, when you need it.  We recommend signing up for the patient portal called "MyChart".  Sign up information is provided on this After Visit Summary.  MyChart is used to connect with patients for Virtual Visits (Telemedicine).  Patients are able to view lab/test results, encounter notes, upcoming appointments, etc.  Non-urgent messages can be sent to your provider as well.   To learn more about what you can do with MyChart, go to [email or web address].    Your next appointment:   6 month(s)  The format for your next appointment:   In Person  Provider:   Reatha Harps, MD {

## 2022-04-08 ENCOUNTER — Telehealth: Payer: Self-pay | Admitting: Cardiovascular Disease

## 2022-04-08 ENCOUNTER — Other Ambulatory Visit: Payer: Self-pay

## 2022-04-08 NOTE — Telephone Encounter (Signed)
Patient is calling to let Dr. Flora Lipps and nurse know that he needs a FMLA note that covers him from future appts and if he is not feeling well due to his condition that will cover him from work. Please call to discuss

## 2022-04-08 NOTE — Telephone Encounter (Signed)
Received forms- will review with MD to fix.  Thanks!

## 2022-04-08 NOTE — Telephone Encounter (Signed)
Stated he talked to his HR department at work and that he needs the [facility] paperwork reworded. Advise patient to drop old paper work back at the office with an attachment of the details of what he needs. Dr. Kathalene Frames nurse will be in touch once papers are received and redone if appropriate. Verbalized understanding and agreement.

## 2022-04-10 ENCOUNTER — Emergency Department (HOSPITAL_BASED_OUTPATIENT_CLINIC_OR_DEPARTMENT_OTHER)
Admission: EM | Admit: 2022-04-10 | Discharge: 2022-04-10 | Disposition: A | Discharge status: Home / Self Care | Payer: Worker's Compensation | Attending: Emergency Medicine | Admitting: Emergency Medicine

## 2022-04-10 ENCOUNTER — Other Ambulatory Visit: Payer: Self-pay

## 2022-04-10 ENCOUNTER — Emergency Department (HOSPITAL_BASED_OUTPATIENT_CLINIC_OR_DEPARTMENT_OTHER): Payer: Worker's Compensation

## 2022-04-10 ENCOUNTER — Encounter (HOSPITAL_BASED_OUTPATIENT_CLINIC_OR_DEPARTMENT_OTHER): Payer: Self-pay | Admitting: Emergency Medicine

## 2022-04-10 DIAGNOSIS — W208XXA Other cause of strike by thrown, projected or falling object, initial encounter: Secondary | ICD-10-CM | POA: Insufficient documentation

## 2022-04-10 DIAGNOSIS — I2510 Atherosclerotic heart disease of native coronary artery without angina pectoris: Secondary | ICD-10-CM | POA: Insufficient documentation

## 2022-04-10 DIAGNOSIS — Z7982 Long term (current) use of aspirin: Secondary | ICD-10-CM | POA: Insufficient documentation

## 2022-04-10 DIAGNOSIS — S0990XA Unspecified injury of head, initial encounter: Secondary | ICD-10-CM | POA: Diagnosis not present

## 2022-04-10 DIAGNOSIS — Y990 Civilian activity done for income or pay: Secondary | ICD-10-CM | POA: Insufficient documentation

## 2022-04-10 DIAGNOSIS — Z79899 Other long term (current) drug therapy: Secondary | ICD-10-CM | POA: Insufficient documentation

## 2022-04-10 DIAGNOSIS — Z87891 Personal history of nicotine dependence: Secondary | ICD-10-CM | POA: Diagnosis not present

## 2022-04-10 MED ORDER — ONDANSETRON 4 MG PO TBDP
4.0000 mg | ORAL_TABLET | Freq: Once | ORAL | Status: AC
  Administered 2022-04-10: 4 mg via ORAL
  Filled 2022-04-10: qty 1

## 2022-04-10 MED ORDER — HYDROCODONE-ACETAMINOPHEN 5-325 MG PO TABS
1.0000 | ORAL_TABLET | Freq: Once | ORAL | Status: AC
  Administered 2022-04-10: 1 via ORAL
  Filled 2022-04-10: qty 1

## 2022-04-10 MED ORDER — ACETAMINOPHEN 325 MG PO TABS: 650.0000 mg | ORAL_TABLET | Freq: Four times a day (QID) | ORAL | 0 refills | Status: AC | PRN

## 2022-04-10 NOTE — ED Provider Notes (Signed)
[facility] EMERGENCY DEPARTMENT Provider Note   CSN: [id number] Arrival date & time: 04/10/22  [id number]     History  Chief Complaint  Patient presents with   Head Injury    Kevin Lloyd is a 46 y.o. male.  Patient as above with significant medical history as below, including NSTEMI, PCI status post stent placement, CAD, hyperlipidemia, Brilinta use who presents to the ED with complaint of head injury.  Patient reports in the last hour he was at work, he was moving some boxes when a piece of freight fell onto him and struck him in the head.  Struck him in the top of the head.  Felt "dazed" for around 10 minutes.  + transient vision changes.  Headache.  No LOC.  No confusion.  At this time symptoms have resolved other than headache.  No neck pain. no numbness or tingling.  No nausea or vomiting.  No vision abnormalities currently.  No neck pain.  Ambulatory with steady gait.  No other injuries reported.  Compliant with Brilinta     Past Medical History:  Diagnosis Date   Acid reflux    Chest pain 08/11/2021   Coronary artery disease    Hyperlipidemia    Kidney stone    NSTEMI (non-ST elevated myocardial infarction) (HCC) 08/11/2021    Past Surgical History:  Procedure Laterality Date   CARDIAC CATHETERIZATION     CORONARY STENT INTERVENTION N/A 08/13/2021   Procedure: CORONARY STENT INTERVENTION;  Surgeon: Tonny Bollmanooper, Michael, MD;  [facility];  Service: Cardiovascular;  Laterality: N/A;   LEFT HEART CATH AND CORONARY ANGIOGRAPHY N/A 08/13/2021   Procedure: LEFT HEART CATH AND CORONARY ANGIOGRAPHY;  Surgeon: Tonny Bollmanooper, Michael, MD;  [facility];  Service: Cardiovascular;  Laterality: N/A;   WISDOM TOOTH EXTRACTION     WISDOM TOOTH EXTRACTION       The history is provided by the patient. No language interpreter was used.  Head Injury Associated symptoms: headache   Associated symptoms: no nausea and no vomiting        Home  Medications Prior to Admission medications   Medication Sig Start Date End Date Taking? Authorizing Provider  acetaminophen (TYLENOL) 325 MG tablet Take 2 tablets (650 mg total) by mouth every 6 (six) hours as needed. 04/10/22  Yes Tanda RockersGray, Jarren Para A, DO  Ascorbic Acid (VITAMIN C ADULT GUMMIES PO) Take 4 tablets by mouth every evening.    [provider]  aspirin 81 MG EC tablet Take 1 tablet (81 mg total) by mouth daily. Swallow whole. 08/14/21   Chandrasekhar, [place] DragonMahesh A, MD  lansoprazole (PREVACID) 15 MG capsule Take 15 mg by mouth every evening.    [provider]  metoprolol tartrate (LOPRESSOR) 25 MG tablet Take 1 tablet (25 mg total) by mouth 2 (two) times daily. 08/14/21   Chandrasekhar, Rondel JumboMahesh A, MD  nitroGLYCERIN (NITROSTAT) 0.4 MG SL tablet Place 1 tablet (0.4 mg total) under the tongue every 5 (five) minutes x 3 doses as needed for chest pain. 03/22/22   O'NealRonnald Ramp, [place] Thomas, MD  rosuvastatin (CRESTOR) 10 MG tablet Take 1 tablet (10 mg total) by mouth daily. 02/18/22 05/19/22  O'NealRonnald Ramp, Henry Thomas, MD  tadalafil (CIALIS) 10 MG tablet Take 1 tablet (10 mg total) by mouth daily as needed for erectile dysfunction. 10/18/21   O'NealRonnald Ramp, Hick[place] Thomas, MD  ticagrelor (BRILINTA) 90 MG TABS tablet Take by mouth 2 (two) times daily.    [provider]  varenicline (CHANTIX)  0.5 MG tablet Take 1 tablet (0.5 mg total) by mouth 2 (two) times daily. 03/22/22   O'NealRonnald Ramp, MD  zinc gluconate 50 MG tablet Take 50 mg by mouth every evening.    [provider]      Allergies    Azithromycin    Review of Systems   Review of Systems  Constitutional:  Negative for chills and fever.  HENT:  Negative for facial swelling and trouble swallowing.   Eyes:  Positive for visual disturbance. Negative for photophobia.  Respiratory:  Negative for cough and shortness of breath.   Cardiovascular:  Negative for chest pain and palpitations.  Gastrointestinal:  Negative for  abdominal pain, nausea and vomiting.  Endocrine: Negative for polydipsia and polyuria.  Genitourinary:  Negative for difficulty urinating and hematuria.  Musculoskeletal:  Negative for gait problem and joint swelling.  Skin:  Negative for pallor and rash.  Neurological:  Positive for headaches. Negative for syncope.  Psychiatric/Behavioral:  Negative for agitation and confusion.     Physical Exam Updated Vital Signs BP 122/88   Pulse (!) 59   Temp 97.9 F (36.6 C)   Resp 16   Ht  (1.753 m)   Wt 97.5 kg   SpO2 99%   BMI 31.75 kg/m  Physical Exam Vitals and nursing note reviewed.  Constitutional:      General: He is not in acute distress.    Appearance: Normal appearance. He is well-developed. He is not ill-appearing, toxic-appearing or diaphoretic.  HENT:     Head: Normocephalic and atraumatic. No raccoon eyes, Battle's sign, right periorbital erythema or left periorbital erythema.     Jaw: There is normal jaw occlusion. No trismus.     Right Ear: External ear normal. No hemotympanum.     Left Ear: External ear normal. No hemotympanum.     Nose: Nose normal.     Mouth/Throat:     Mouth: Mucous membranes are moist.  Eyes:     General: Vision grossly intact. Gaze aligned appropriately. No scleral icterus.    Extraocular Movements: Extraocular movements intact.     Right eye: Normal extraocular motion.     Left eye: Normal extraocular motion.     Conjunctiva/sclera: Conjunctivae normal.     Pupils: Pupils are equal, round, and reactive to light.  Cardiovascular:     Rate and Rhythm: Normal rate and regular rhythm.     Pulses: Normal pulses.          Radial pulses are 2+ on the right side and 2+ on the left side.       Posterior tibial pulses are 2+ on the right side and 2+ on the left side.  Pulmonary:     Effort: Pulmonary effort is normal. No respiratory distress.  Abdominal:     General: Abdomen is flat. There is no distension.     Palpations: Abdomen is soft.   Musculoskeletal:        General: Normal range of motion.     Cervical back: Full passive range of motion without pain and normal range of motion. No rigidity. No spinous process tenderness.     Right lower leg: No edema.     Left lower leg: No edema.     Comments: Left ankle brace  Skin:    General: Skin is warm and dry.     Capillary Refill: Capillary refill takes less than 2 seconds.  Neurological:     General: No focal deficit present.  Mental Status: He is alert and oriented to person, place, and time.     GCS: GCS eye subscore is 4. GCS verbal subscore is 5. GCS motor subscore is 6.     Cranial Nerves: Cranial nerves 2-12 are intact. No dysarthria or facial asymmetry.     Sensory: Sensation is intact. No sensory deficit.     Motor: Motor function is intact. No tremor or pronator drift.     Coordination: Coordination is intact.     Gait: Gait is intact.  Psychiatric:        Mood and Affect: Mood normal.        Behavior: Behavior normal.     ED Results / Procedures / Treatments   Labs (all labs ordered are listed, but only abnormal results are displayed) Labs Reviewed - No data to display  EKG None  Radiology CT Head Wo Contrast  Result Date: 04/10/2022 CLINICAL DATA:  Head trauma, coagulopathy (Age 66-64y) EXAM: CT HEAD WITHOUT CONTRAST TECHNIQUE: Contiguous axial images were obtained from the base of the skull through the vertex without intravenous contrast. RADIATION DOSE REDUCTION: This exam was performed according to the departmental dose-optimization program which includes automated exposure control, adjustment of the mA and/or kV according to patient size and/or use of iterative reconstruction technique. COMPARISON:  CT May 6, 23. FINDINGS: Brain: No evidence of acute infarction, hemorrhage, hydrocephalus, extra-axial collection or mass lesion/mass effect. Vascular: No hyperdense vessel identified. Skull: No acute fracture. Sinuses/Orbits: Clear sinuses.  No acute  orbital findings. Other: No mastoid effusions. IMPRESSION: No evidence of acute intracranial abnormality. Electronically Signed   By: Feliberto Harts M.D.   On: 04/10/2022 08:02    Procedures Procedures    Medications Ordered in ED Medications  HYDROcodone-acetaminophen (NORCO/VICODIN) 5-325 MG per tablet 1 tablet (1 tablet Oral Given 04/10/22 0745)  ondansetron (ZOFRAN-ODT) disintegrating tablet 4 mg (4 mg Oral Given 04/10/22 0745)    ED Course/ Medical Decision Making/ A&P                           Medical Decision Making Amount and/or Complexity of Data Reviewed Radiology: ordered.  Risk OTC drugs. Prescription drug management.    CC: Head injury  This patient presents to the Emergency Department for the above complaint. This involves an extensive number of treatment options and is a complaint that carries with it a high risk of complications and morbidity. Vital signs were reviewed. Serious etiologies considered.  Differential diagnoses for head trauma includes subdural hematoma, epidural hematoma, acute concussion, traumatic subarachnoid hemorrhage, cerebral contusions, etc.   Record review:  Previous records obtained and reviewed prior ED visits, prior labs and imaging, home medication, prior procedures/heart cath and echo (10/22 EF 50 to 55%, grade 1 diastolic dysfunction)  Additional history obtained from N/A  Medical and surgical history as noted above.   Work up as above, notable for:  Labs & imaging results that were available during my care of the patient were visualized by me and considered in my medical decision making.   Patient with head trauma, he is coagulopathic on Brilinta.  Will obtain CT imaging of the head  I ordered imaging studies which included CT head. I visualized the imaging, interpreted images, and I agree with radiologist interpretation.  No acute process   Management: Analgesic, antiemetic  Reassessment:  Patient is feeling better,  headache is nearly resolved.  Neurologically intact.  Gait steady.  No nausea or vomiting.  Family will pick him up to take him home.  Work note provided.  Stable for discharge  Admission was considered.     Patient presents with low mechanism head trauma. On initial evaluation patient appears in no acute distress, afebrile with normal vital signs. Patient has completely intact neurovascular exam, and pain improved in ED.  CT imaging pursued  (canadian ct head rule not applicable as coagulopathic) and found to be without acute pathology.    Discussed possible etiology of concussion, signs and symptoms, and discharge instructions. Detailed discussions were had with the patient regarding current findings, and need for close f/u with PCP or on call doctor. The patient has been instructed to return immediately if the symptoms worsen in any way for re-evaluation. Patient verbalized understanding and is in agreement with current care plan. 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  Tobacco Use   Smoking status: Former    Packs/day: 1.50    Types: Cigarettes    Quit date: 08/11/2021    Years since quitting: 0.6   Smokeless tobacco: Never  Vaping Use   Vaping Use: Never used  Substance and Sexual Activity   Alcohol use: Yes    Comment: rare   Drug use: No   Sexual activity: Yes  Other Topics Concern   Not on file  Social History Narrative   Not on file   Social Determinants of Health   Financial Resource Strain: Not on file  Food Insecurity: Not on file  Transportation Needs: Not on file  Physical Activity: Not on file  Stress: Not on file  Social Connections: Not on file  Intimate  Partner Violence: Not on file      This chart was dictated using voice recognition software.  Despite best efforts to proofread,  errors can occur which can change the documentation meaning.         Final Clinical Impression(s) / ED Diagnoses Final diagnoses:  Minor head injury without loss of consciousness, initial encounter    Rx / DC Orders ED Discharge Orders          Ordered    acetaminophen (TYLENOL) 325 MG tablet  Every 6 hours PRN        04/10/22 0814              Sloan Leiter, DO 04/10/22 [phone]

## 2022-04-10 NOTE — Discharge Instructions (Addendum)
Based on the events which brought you to the ER today, it is possible that you may have a concussion. A concussion occurs when there is a blow to the head or body, with enough force to shake the brain and disrupt how the brain functions. You may experience symptoms such as headaches, sensitivity to light/noise, dizziness, cognitive slowing, difficulty concentrating / remembering, trouble sleeping and drowsiness. These symptoms may last anywhere from hours/days to potentially weeks/months. While these symptoms are very frustrating and perhaps debilitating, it is important that you remember that they will improve over time. Everyone has a different rate of recovery; it is difficult to predict when your symptoms will resolve. In order to allow for your brain to heal after the injury, we recommend that you see your primary physician or a physician knowledgeable in concussion management. We also advise you to let your body and brain rest: avoid physical activities (sports, gym, and exercise) and reduce cognitive demands (reading, texting, TV watching, computer use, video games, etc). School attendance, after-school activities and work may need to be modified to avoid increasing symptoms. We recommend against driving until until all symptoms have resolved. Come back to the ER right away if you are having repeated episodes of vomiting, severe/worsening headache/dizziness or any other symptom that alarms you. We recommended that someone stay with you for the next 24 hours to monitor for these worrisome symptoms.  

## 2022-04-10 NOTE — ED Triage Notes (Signed)
Pt arrives pov, to triage in wheelchair, c/o shelf at work hitting top of head. Denies loc, or blurred vision GCS 15

## 2022-04-18 NOTE — Telephone Encounter (Signed)
Messaged patient via mychart- advised paperwork would not be fixed. Explained why. He will come pick up from the office.  Thanks!

## 2022-05-02 ENCOUNTER — Encounter: Payer: Self-pay | Admitting: *Deleted

## 2022-05-03 ENCOUNTER — Telehealth: Payer: Self-pay | Admitting: *Deleted

## 2022-05-03 ENCOUNTER — Ambulatory Visit: Payer: 59 | Admitting: Neurology

## 2022-05-03 ENCOUNTER — Encounter: Payer: Self-pay | Admitting: Neurology

## 2022-05-03 NOTE — Telephone Encounter (Signed)
Pt no showed appointment.

## 2022-05-10 ENCOUNTER — Other Ambulatory Visit: Payer: Self-pay

## 2022-06-28 ENCOUNTER — Other Ambulatory Visit: Payer: Self-pay | Admitting: Internal Medicine

## 2022-06-28 NOTE — Telephone Encounter (Signed)
This is Dr. O'Neal's pt 

## 2022-07-03 IMAGING — CT CT HEAD W/O CM
3 series · 16 of 47 positions shown, 19 images · non-contrast
Comparison: CT March 02, 22.

CLINICAL DATA: Head trauma, coagulopathy (Age 18-64y)



[Series 2: head wo · axial · 0.41mm/px · z∈[-173,-43]mm · 10 of 32 slices shown, 13 images]
[im 3/32  brain]
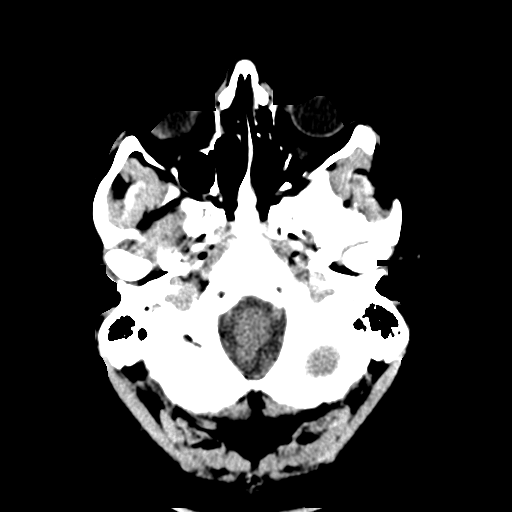
[im 3/32  bone]
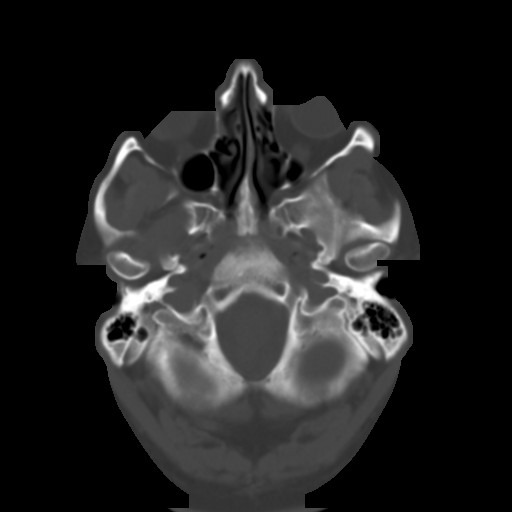
[im 6/32  brain]
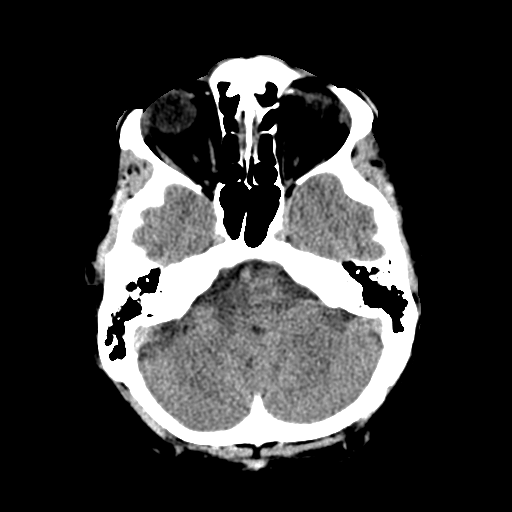
[im 9/32  brain]
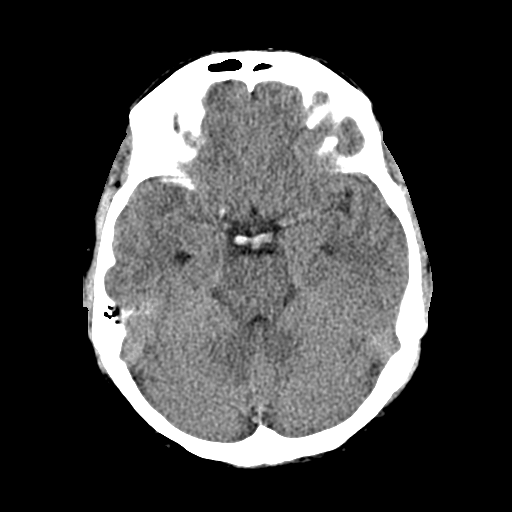
[im 11/32  brain]
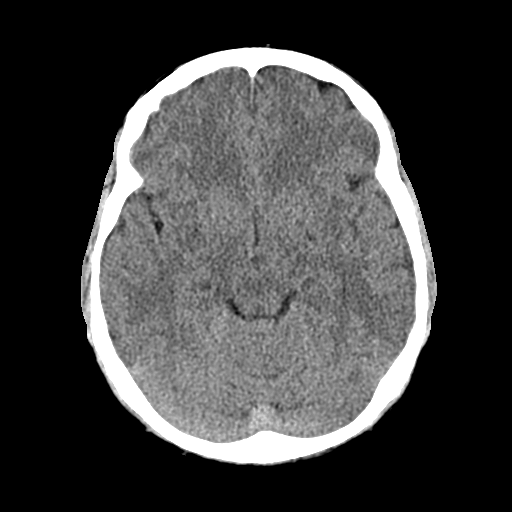
[im 14/32  brain]
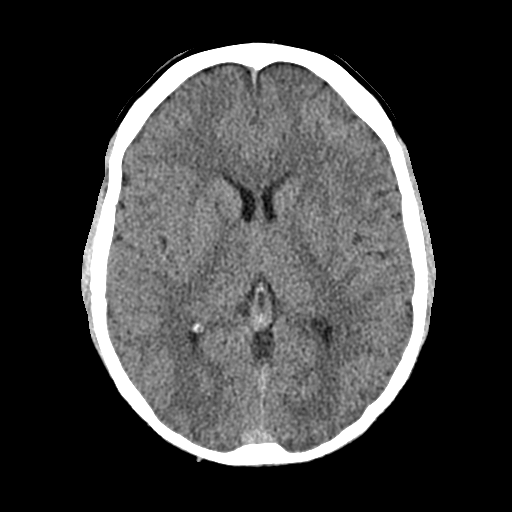
[im 14/32  bone]
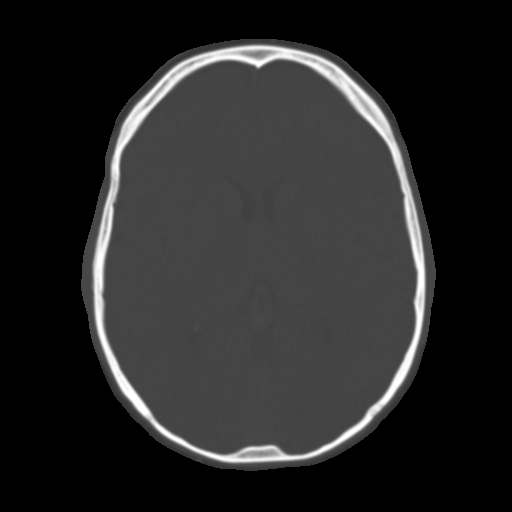
[im 18/32  brain]
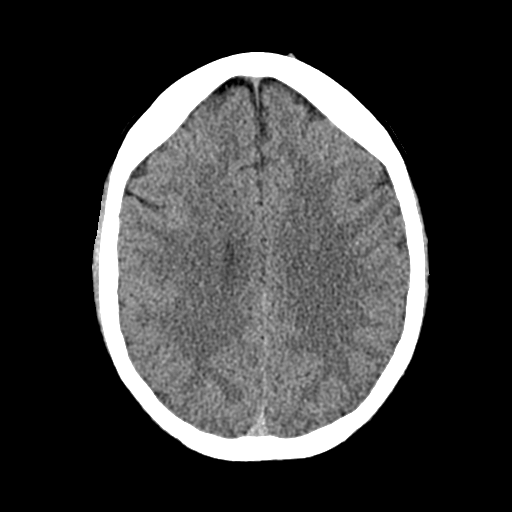
[im 21/32  brain]
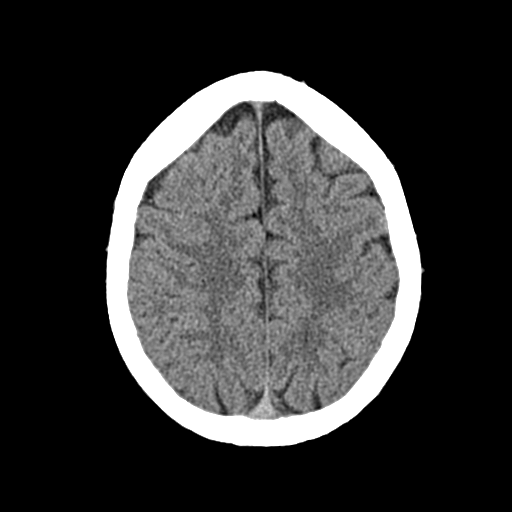
[im 24/32  brain]
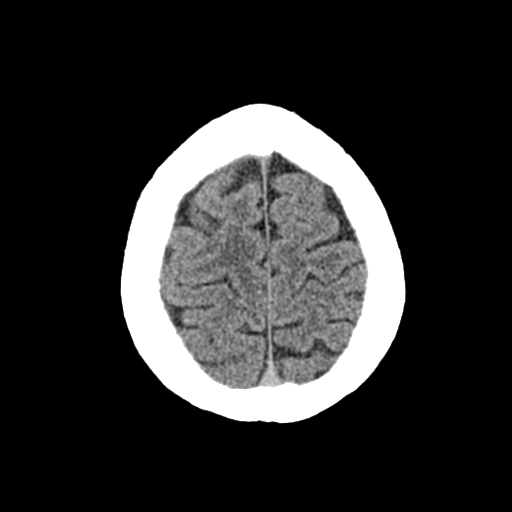
[im 26/32  brain]
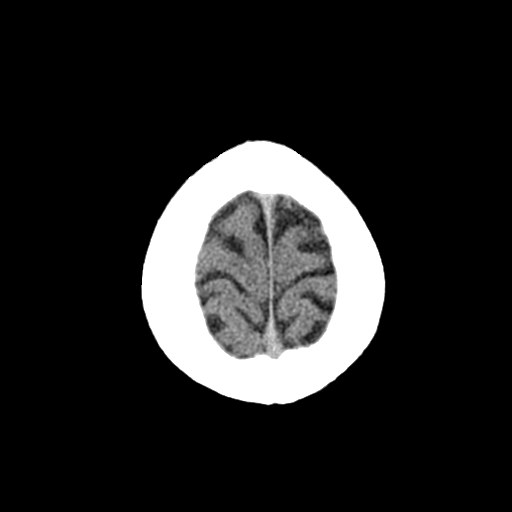
[im 26/32  bone]
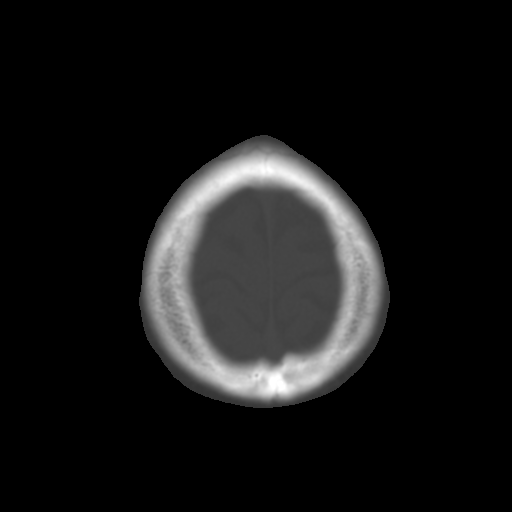
[im 29/32  brain]
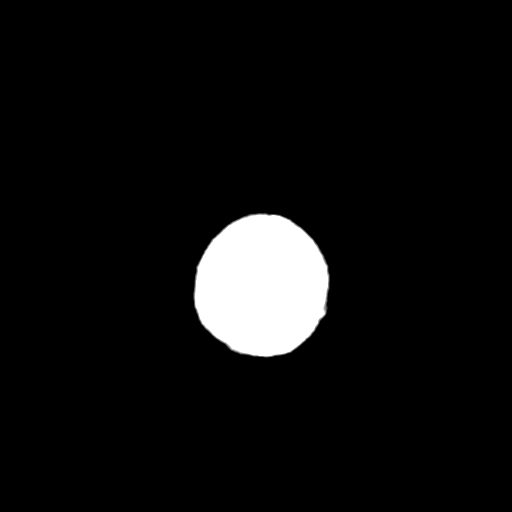

[Series 4: cor soft · coronal · 0.34mm/px · 3 of 64 slices shown]
[im 22/64  brain]
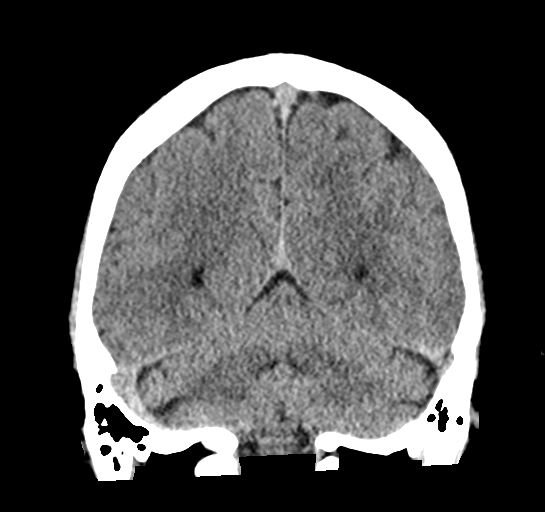
[im 29/64  brain]
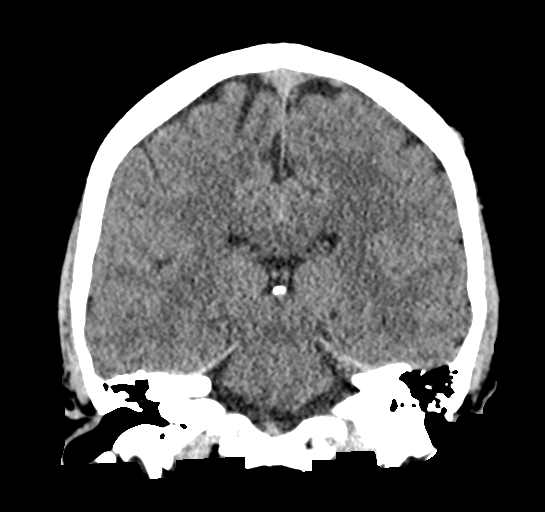
[im 36/64  brain]
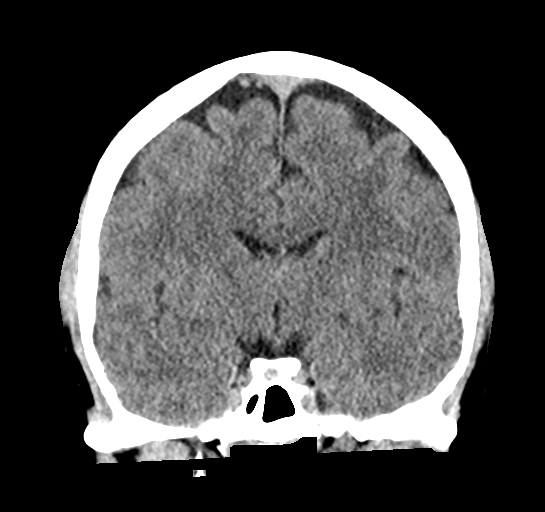

[Series 5: sag soft · sagittal · 0.34mm/px · 3 of 55 slices shown]
[im 19/55  brain]
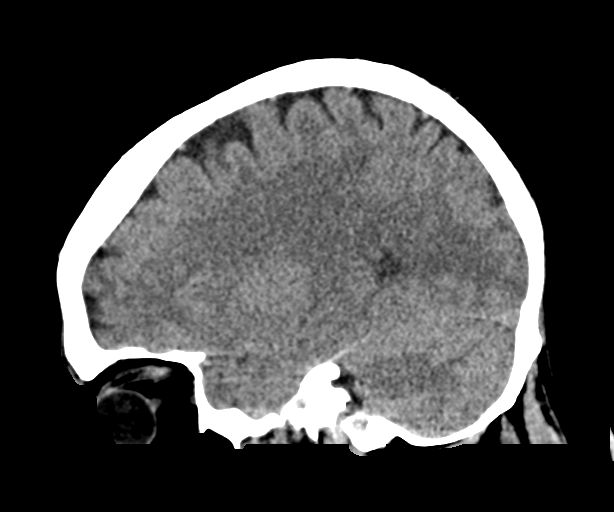
[im 28/55  brain]
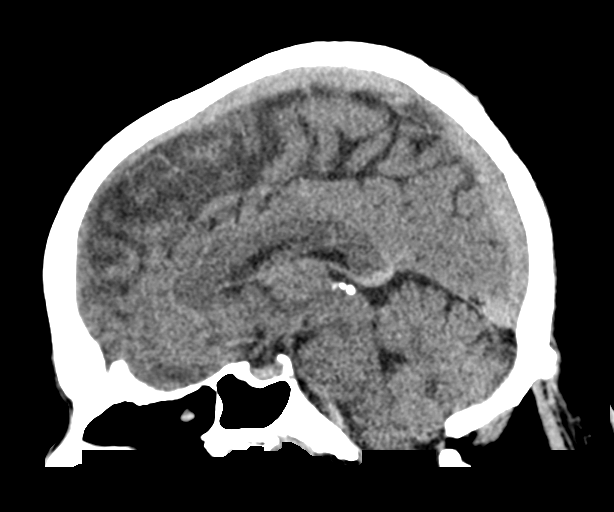
[im 37/55  brain]
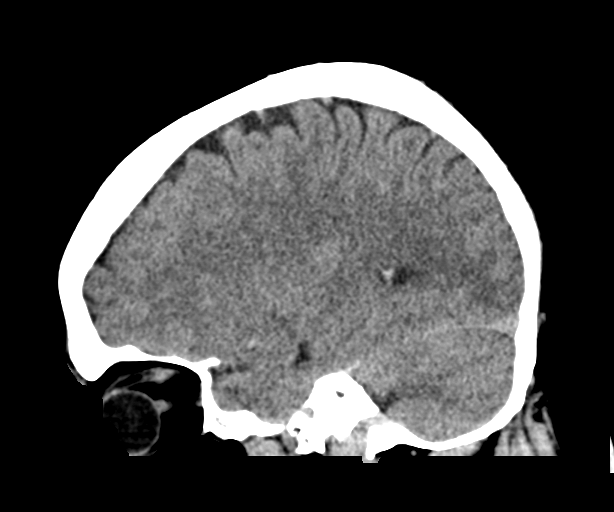

[16 of 47 positions shown; findings below may reference images not displayed]

FINDINGS: Brain: No evidence of acute infarction, hemorrhage, hydrocephalus,
extra-axial collection or mass lesion/mass effect.

Vascular: No hyperdense vessel identified.

Skull: No acute fracture.

Sinuses/Orbits: Clear sinuses.  No acute orbital findings.

Other: No mastoid effusions.
IMPRESSION: No evidence of acute intracranial abnormality.

## 2022-07-16 ENCOUNTER — Other Ambulatory Visit: Payer: Self-pay

## 2022-07-16 ENCOUNTER — Emergency Department (HOSPITAL_BASED_OUTPATIENT_CLINIC_OR_DEPARTMENT_OTHER): Payer: Worker's Compensation

## 2022-07-16 ENCOUNTER — Encounter (HOSPITAL_BASED_OUTPATIENT_CLINIC_OR_DEPARTMENT_OTHER): Payer: Self-pay | Admitting: Urology

## 2022-07-16 ENCOUNTER — Emergency Department (HOSPITAL_BASED_OUTPATIENT_CLINIC_OR_DEPARTMENT_OTHER)
Admission: EM | Admit: 2022-07-16 | Discharge: 2022-07-16 | Disposition: A | Discharge status: Home / Self Care | Payer: Worker's Compensation | Attending: Emergency Medicine | Admitting: Emergency Medicine

## 2022-07-16 DIAGNOSIS — S8991XA Unspecified injury of right lower leg, initial encounter: Secondary | ICD-10-CM | POA: Insufficient documentation

## 2022-07-16 DIAGNOSIS — W1789XA Other fall from one level to another, initial encounter: Secondary | ICD-10-CM | POA: Diagnosis not present

## 2022-07-16 DIAGNOSIS — Z7982 Long term (current) use of aspirin: Secondary | ICD-10-CM | POA: Diagnosis not present

## 2022-07-16 DIAGNOSIS — S8001XA Contusion of right knee, initial encounter: Secondary | ICD-10-CM

## 2022-07-16 DIAGNOSIS — Y990 Civilian activity done for income or pay: Secondary | ICD-10-CM | POA: Diagnosis not present

## 2022-07-16 NOTE — ED Triage Notes (Signed)
Pt states right knee injury after boxes fell out of truck onto right knee  States sharp pain with weight bearing No previous injury to knee

## 2022-07-16 NOTE — ED Provider Notes (Signed)
[facility] EMERGENCY DEPARTMENT Provider Note   CSN: [id number] Arrival date & time: 07/16/22  0334     History  Chief Complaint  Patient presents with   Knee Injury    Kevin Lloyd is a 46 y.o. male.  Patient is a 46 year old male presenting with complaints of a right knee injury.  He reports unloading a truck when boxes fell out.  Struck him in the right knee.  He has had pain with ambulation since.  Pain is worse when he ambulates and relieved somewhat with rest.  The history is provided by the patient.       Home Medications Prior to Admission medications   Medication Sig Start Date End Date Taking? Authorizing Provider  acetaminophen (TYLENOL) 325 MG tablet Take 2 tablets (650 mg total) by mouth every 6 (six) hours as needed. 04/10/22   Sloan Leiter, DO  Ascorbic Acid (VITAMIN C ADULT GUMMIES PO) Take 4 tablets by mouth every evening.    [provider]  aspirin 81 MG EC tablet Take 1 tablet (81 mg total) by mouth daily. Swallow whole. 08/14/21   Chandrasekhar, Lafayette Dragon A, MD  lansoprazole (PREVACID) 15 MG capsule Take 15 mg by mouth every evening.    [provider]  metoprolol tartrate (LOPRESSOR) 25 MG tablet Take 1 tablet (25 mg total) by mouth 2 (two) times daily. 08/14/21   Chandrasekhar, Mahesh A, MD  nitroGLYCERIN (NITROSTAT) 0.4 MG SL tablet PLACE 1 TABLET UNDER THE TONGUE EVERY 5 MINUTES X 3 DOSES AS NEEDED CHEST PAIN 06/28/22   O'Neal, Ronnald Ramp, MD  rosuvastatin (CRESTOR) 10 MG tablet Take 1 tablet (10 mg total) by mouth daily. 02/18/22 05/19/22  O'NealRonnald Ramp, MD  tadalafil (CIALIS) 10 MG tablet Take 1 tablet (10 mg total) by mouth daily as needed for erectile dysfunction. 10/18/21   Sande Rives, MD  ticagrelor (BRILINTA) 90 MG TABS tablet TAKE 1 TABLET BY MOUTH TWICE DAILY 06/28/22   O'Neal, Ronnald Ramp, MD  varenicline (CHANTIX) 0.5 MG tablet Take 1 tablet (0.5 mg total) by mouth 2 (two) times daily. 03/22/22    O'NealRonnald Ramp, MD  zinc gluconate 50 MG tablet Take 50 mg by mouth every evening.    [provider]      Allergies    Azithromycin    Review of Systems   Review of Systems  All other systems reviewed and are negative.   Physical Exam Updated Vital Signs BP (!) 139/92 (BP Location: Right Arm)   Pulse 77   Temp 97.7 F (36.5 C) (Oral)   Resp 18   Ht 5\' 9"  (1.753 m)   Wt 97.5 kg   SpO2 100%   BMI 31.74 kg/m  Physical Exam Vitals and nursing note reviewed.  Constitutional:      General: He is not in acute distress.    Appearance: Normal appearance. He is not ill-appearing.  HENT:     Head: Normocephalic and atraumatic.  Pulmonary:     Effort: Pulmonary effort is normal.  Musculoskeletal:     Comments: The right knee appears grossly normal.  There is some tenderness superior to the patella, but no deformity, ecchymosis, or significant swelling.  There is no palpable effusion.  He has pain with range of motion, but there is no crepitus and no laxity with varus or valgus tension.  Lachman's test negative.  Skin:    General: Skin is warm and dry.  Neurological:     Mental Status:  He is alert.     ED Results / Procedures / Treatments   Labs (all labs ordered are listed, but only abnormal results are displayed) Labs Reviewed - No data to display  EKG None  Radiology No results found.  Procedures Procedures    Medications Ordered in ED Medications - No data to display  ED Course/ Medical Decision Making/ A&P  X-rays are negative for fracture.  The knee seems stable to my exam.  Patient to be discharged with rest, ice, ibuprofen, and follow-up with primary doctor if not improving.  Final Clinical Impression(s) / ED Diagnoses Final diagnoses:  None    Rx / DC Orders ED Discharge Orders     None         Veryl Speak, MD 07/16/22 [id number]

## 2022-07-16 NOTE — Discharge Instructions (Signed)
Take ibuprofen 600 mg every 6 hours as needed for pain.  Follow-up with primary doctor if not improving in the next week. 

## 2022-07-30 ENCOUNTER — Other Ambulatory Visit: Payer: Self-pay | Admitting: Internal Medicine

## 2022-07-30 NOTE — Telephone Encounter (Signed)
Rx refill sent to pharmacy. 

## 2023-01-08 ENCOUNTER — Encounter: Payer: Self-pay | Admitting: Physician Assistant

## 2023-01-08 ENCOUNTER — Ambulatory Visit: Payer: 59 | Admitting: Physician Assistant

## 2023-01-08 VITALS — BP 115/77 | HR 83 | Temp 97.8°F | Ht 69.0 in | Wt 230.6 lb

## 2023-01-08 DIAGNOSIS — Z7289 Other problems related to lifestyle: Secondary | ICD-10-CM

## 2023-01-08 DIAGNOSIS — I214 Non-ST elevation (NSTEMI) myocardial infarction: Secondary | ICD-10-CM

## 2023-01-08 DIAGNOSIS — M7730 Calcaneal spur, unspecified foot: Secondary | ICD-10-CM | POA: Diagnosis not present

## 2023-01-08 DIAGNOSIS — N528 Other male erectile dysfunction: Secondary | ICD-10-CM

## 2023-01-08 NOTE — Progress Notes (Signed)
Subjective:    Patient ID: Kevin Lloyd, male    DOB: [birth year], 47 y.o.   MRN: [id number]  Chief Complaint  Patient presents with   Establish Care    HPI 47 y.o. patient presents today for new patient establishment with me.  Patient was previously established with Dr. Nancy Fetter at [place] Team: Dr. Audie Box - Cardiology   Podiatry - [facility]   Concerns: Currently vaping, started about 8 months ago, would like to look at options or ways to quit.   Physically demanding daily work- last provider gave 1 day per month intermittent of FMLA; states he may need more time because of pain in feet.   Past Medical History:  Diagnosis Date   Acid reflux    Chest pain 08/11/2021   Coronary artery disease    Hyperlipidemia    Kidney stone    NSTEMI (non-ST elevated myocardial infarction) ([place]) 08/11/2021   Vertigo     Past Surgical History:  Procedure Laterality Date   CARDIAC CATHETERIZATION     CORONARY STENT INTERVENTION N/A 08/13/2021   Procedure: CORONARY STENT INTERVENTION;  Surgeon: Sherren Mocha, MD;  Location: [facility];  Service: Cardiovascular;  Laterality: N/A;   LEFT HEART CATH AND CORONARY ANGIOGRAPHY N/A 08/13/2021   Procedure: LEFT HEART CATH AND CORONARY ANGIOGRAPHY;  Surgeon: Sherren Mocha, MD;  Location: [facility];  Service: Cardiovascular;  Laterality: N/A;   WISDOM TOOTH EXTRACTION     WISDOM TOOTH EXTRACTION      Family History  Problem Relation Age of Onset   Heart attack Father    Pneumonia Father        passed from this   Cirrhosis Father    Diabetes Paternal Grandmother    Heart attack Paternal Grandfather     Social History   Tobacco Use   Smoking status: Former    Packs/day: 1.50    Types: Cigarettes    Quit date: 08/11/2021    Years since quitting: 1.4   Smokeless tobacco: Never  Vaping Use   Vaping Use: Never used  Substance Use Topics   Alcohol use: Yes    Comment: rare   Drug  use: No     Allergies  Allergen Reactions   Azithromycin Other (See Comments)    Caused his tongue to breakout with bumps    Review of Systems NEGATIVE UNLESS OTHERWISE INDICATED IN HPI      Objective:     BP 115/77   Pulse 83   Temp 97.8 F (36.6 C) (Temporal)   Ht '5\' 9"'$  (1.753 m)   Wt 230 lb 9.6 oz (104.6 kg)   SpO2 100%   BMI 34.05 kg/m   Wt Readings from Last 3 Encounters:  01/08/23 230 lb 9.6 oz (104.6 kg)  07/16/22 214 lb 15.2 oz (97.5 kg)  04/10/22 215 lb (97.5 kg)    BP Readings from Last 3 Encounters:  01/08/23 115/77  07/16/22 (!) 139/92  04/10/22 122/80     Physical Exam Vitals and nursing note reviewed.  Constitutional:      Appearance: Normal appearance. He is obese.  Cardiovascular:     Rate and Rhythm: Normal rate and regular rhythm.     Pulses: Normal pulses.     Heart sounds: Normal heart sounds. No murmur heard. Pulmonary:     Effort: Pulmonary effort is normal.     Breath sounds: Normal breath sounds.  Neurological:  General: No focal deficit present.     Mental Status: He is alert and oriented to person, place, and time.  Psychiatric:        Mood and Affect: Mood normal.        Assessment & Plan:  NSTEMI (non-ST elevated myocardial infarction) ([facility])  Current every day vaping  Other male erectile dysfunction  Heel spur, unspecified laterality   Pleasant new 47 year old male establishing care today.  He has a history of an NSTEMI on 08/11/2021.  Former smoker.  Currently started vaping in the last 8 months, curious about new ways to quit this.  Told him I would look into this for him.  He is going to reestablish with his cardiologist.  He is currently taking Crestor 10 mg daily.  Lopressor 25 mg twice daily.  Aspirin 81 mg daily.  He has Nitrostat in case he needs it.  He also has concerns about erectile dysfunction.  States that he has Cialis 10 mg that he takes daily and this does not seem to be helping as much anymore.   Plan to get him set up with Dr. Randol Kern in our office to talk about other options.  Sounds like he has bilateral heel spurs.  He is going to follow-up with his podiatrist on this.  Encouraged continued work on lifestyle changes.  Return in about 6 months (around 07/11/2023) for CPE, fasting labs .  This note was prepared with assistance of Systems analyst. Occasional wrong-word or sound-a-like substitutions may have occurred due to the inherent limitations of voice recognition software.  Time Spent: 37 minutes of total time was spent on the date of the encounter performing the following actions: chart review prior to seeing the patient, obtaining history, performing a medically necessary exam, counseling on the treatment plan, placing orders, and documenting in our EHR.       Malyssa Maris M Maryam Feely, PA-C

## 2023-01-08 NOTE — Patient Instructions (Addendum)
Welcome to [facility]! It was a pleasure meeting you today.  As discussed, Please schedule a 6 month follow up visit today.  Please work on [place] cessation.  You may also schedule with Dr. Randol Kern at our office to discuss men's health concerns.  Reschedule with your cardiologist to follow up annually.   PLEASE NOTE:  If you had any LAB tests please let us know if you have not heard back within a few days. You may see your results on MyChart before we have a chance to review them but we will give you a call once they are reviewed by [place]. If we ordered any REFERRALS today, please let us know if you have not heard from their office within the next two weeks. Let us know through MyChart if you are needing REFILLS, or have your pharmacy send [place] the request. You can also use MyChart to communicate with me or any office staff.  Please try these tips to maintain a healthy lifestyle:  Eat most of your calories during the day when you are active. Eliminate processed foods including packaged sweets (pies, cakes, cookies), reduce intake of potatoes, white bread, white pasta, and white rice. Look for whole grain options, oat flour or almond flour.  Each meal should contain half fruits/vegetables, one quarter protein, and one quarter carbs (no bigger than a computer mouse).  Cut down on sweet beverages. This includes juice, soda, and sweet tea. Also watch fruit intake, though this is a healthier sweet option, it still contains natural sugar! Limit to 3 servings daily.  Drink at least 1 glass of water with each meal and aim for at least 8 glasses (64 ounces) per day.  Exercise at least 150 minutes every week to the best of your ability.    Take Care,  Satori Krabill, PA-C

## 2023-02-14 ENCOUNTER — Telehealth: Payer: Self-pay | Admitting: Physician Assistant

## 2023-02-14 NOTE — Telephone Encounter (Signed)
Prescription Request  02/14/2023  LOV: 01/08/2023  What is the name of the medication or equipment?  tadalafil (CIALIS) 10 MG tablet   Have you contacted your pharmacy to request a refill? Yes   Which pharmacy would you like this sent to?   [id number] - [place], [place] - [address] [phone]    Patient notified that their request is being sent to the clinical staff for review and that they should receive a response within 2 business days.   Please advise at Mobile [phone] (mobile)

## 2023-02-17 ENCOUNTER — Other Ambulatory Visit: Payer: Self-pay

## 2023-02-17 MED ORDER — TADALAFIL 10 MG PO TABS: 10.0000 mg | ORAL_TABLET | Freq: Every day | ORAL | 5 refills | Status: DC | PRN

## 2023-02-17 NOTE — Telephone Encounter (Signed)
Please advise, pt requesting medication previously filled by another provider.   Last seen: 01/08/2023  Next OV: 07/15/23

## 2023-02-17 NOTE — Telephone Encounter (Signed)
Rx sent to pharmacy per provider approval 

## 2023-02-18 ENCOUNTER — Other Ambulatory Visit: Payer: Self-pay | Admitting: Physician Assistant

## 2023-02-18 MED ORDER — TADALAFIL 10 MG PO TABS: 10.0000 mg | ORAL_TABLET | Freq: Every day | ORAL | 11 refills | Status: DC | PRN

## 2023-02-18 NOTE — Telephone Encounter (Signed)
Patient states he found that only 10 tablets were sent in for cialis. States he is used to being sent 30 tablets at a time. Pt requests 30 tablets be sent in for cialist.

## 2023-02-23 ENCOUNTER — Other Ambulatory Visit: Payer: Self-pay | Admitting: Internal Medicine

## 2023-02-24 ENCOUNTER — Telehealth: Payer: Self-pay | Admitting: Physician Assistant

## 2023-02-24 ENCOUNTER — Other Ambulatory Visit: Payer: Self-pay | Admitting: Cardiovascular Disease

## 2023-02-24 NOTE — Telephone Encounter (Signed)
Contacted patient and advised these prescriptions are managed Cardiology, Metoprolol was filled today by Cardio and advised pt to contact that office for Rx denied

## 2023-02-24 NOTE — Telephone Encounter (Signed)
Prescription Request  02/24/2023  LOV: 01/08/2023  What is the name of the medication or equipment?  metoprolol tartrate (LOPRESSOR) 25 MG tablet   nitroGLYCERIN (NITROSTAT) 0.4 MG SL tablet   Have you contacted your pharmacy to request a refill? Yes   Which pharmacy would you like this sent to?   [id number] [place], [place] - [address] AT [facility] [address] [facility] [address] [place] [address] Phone: [phone] Fax: [phone]   Patient notified that their request is being sent to the clinical staff for review and that they should receive a response within 2 business days.   Please advise at Mobile [phone] (mobile)

## 2023-03-31 ENCOUNTER — Telehealth: Payer: Self-pay | Admitting: Cardiovascular Disease

## 2023-03-31 MED ORDER — ROSUVASTATIN CALCIUM 10 MG PO TABS: 10.0000 mg | ORAL_TABLET | Freq: Every day | ORAL | 0 refills | Status: DC

## 2023-03-31 MED ORDER — METOPROLOL TARTRATE 25 MG PO TABS: 25.0000 mg | ORAL_TABLET | Freq: Two times a day (BID) | ORAL | 0 refills | Status: DC

## 2023-03-31 NOTE — Telephone Encounter (Signed)
Pt's medications were sent to pt's pharmacy as requested. Confirmation received.  

## 2023-03-31 NOTE — Telephone Encounter (Signed)
*  STAT* If patient is at the pharmacy, call can be transferred to refill team.   1. Which medications need to be refilled? (please list name of each medication and dose if known)  metoprolol tartrate (LOPRESSOR) 25 MG tablet  rosuvastatin (CRESTOR) 10 MG tablet (Expired)   2. Which pharmacy/location (including street and city if local pharmacy) is medication to be sent to?  [id number] - [place], [place] - [address] AT [facility] [address]    3. Do they need a 30 day or 90 day supply? 90 day  Only has two days worth of metoprolol left. Patient is scheduled 08/14 to see Dr. Flora Lipps.

## 2023-06-09 NOTE — Progress Notes (Unsigned)
Cardiology Office Note:   Date:  06/11/2023  NAME:  Kevin Lloyd    MRN: [id number] DOB:  1976-09-08   PCP:  Bary Leriche, PA-C  Cardiologist:  Reatha Harps, MD  Electrophysiologist:  None   Referring MD: Bary Leriche, PA-C   Chief Complaint  Patient presents with   Coronary Artery Disease         History of Present Illness:   Kevin Lloyd is a 47 y.o. male with a hx of CAD, HTN, HLD, tobacco abuse who presents for follow-up.  Denies any symptoms of chest pains or trouble breathing.  Still working at [employer].  Working and Administrator, sports.  Tells me he gets a lot of physical work and.  No symptoms of angina.  Blood pressure is controlled on exam today.  Most recent LDL cholesterol from January shows an LDL of 70.  We discussed increasing his Crestor.  He did not tolerate Lipitor.  Overall without any complaints.  Problem List CAD/NSTEMI -07/2021 -95% OM2 -> PCI -100% RPLV -> Med management -70% RPDA -> Med management  2. HLD -T chol 150, HDL 44, LDL 70, TG 182 3. HTN   Past Medical History: Past Medical History:  Diagnosis Date   Acid reflux    Chest pain 08/11/2021   Coronary artery disease    Hyperlipidemia    Kidney stone    NSTEMI (non-ST elevated myocardial infarction) ([facility]) 08/11/2021   Vertigo     Past Surgical History: Past Surgical History:  Procedure Laterality Date   CARDIAC CATHETERIZATION     CORONARY STENT INTERVENTION N/A 08/13/2021   Procedure: CORONARY STENT INTERVENTION;  Surgeon: Tonny Bollman, MD;  Location: [facility];  Service: Cardiovascular;  Laterality: N/A;   LEFT HEART CATH AND CORONARY ANGIOGRAPHY N/A 08/13/2021   Procedure: LEFT HEART CATH AND CORONARY ANGIOGRAPHY;  Surgeon: Tonny Bollman, MD;  Location: [facility];  Service: Cardiovascular;  Laterality: N/A;   WISDOM TOOTH EXTRACTION     WISDOM TOOTH EXTRACTION      Current Medications: Current Meds  Medication Sig   aspirin 81 MG EC tablet  Take 1 tablet (81 mg total) by mouth daily. Swallow whole.   lansoprazole (PREVACID) 15 MG capsule Take 15 mg by mouth every evening.   metoprolol tartrate (LOPRESSOR) 25 MG tablet Take 1 tablet (25 mg total) by mouth 2 (two) times daily.   rosuvastatin (CRESTOR) 20 MG tablet Take 1 tablet (20 mg total) by mouth daily.   tadalafil (CIALIS) 10 MG tablet Take 1 tablet (10 mg total) by mouth daily as needed for erectile dysfunction.   [DISCONTINUED] nitroGLYCERIN (NITROSTAT) 0.4 MG SL tablet PLACE 1 TABLET UNDER THE TONGUE EVERY 5 MINUTES X 3 DOSES AS NEEDED CHEST PAIN   [DISCONTINUED] rosuvastatin (CRESTOR) 10 MG tablet Take 1 tablet (10 mg total) by mouth daily.     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  Tobacco Use   Smoking status: Former    Current packs/day: 0.00    Types: Cigarettes    Quit date: 08/11/2021    Years since quitting: 1.8   Smokeless tobacco: Never  Vaping Use   Vaping status: Never Used  Substance and Sexual Activity   Alcohol use: Yes    Comment: rare   Drug use: No   Sexual activity: Yes  Other Topics Concern   Not on file  Social History Narrative   Not on file   Social Determinants of Health   Financial Resource Strain: Not on file  Food Insecurity: Not on file  Transportation Needs: Not on file  Physical Activity: Not on file  Stress: Not on file  Social Connections: Not on file     Family History: The patient's family history includes Cirrhosis in his father; Diabetes in his paternal grandmother; Heart attack in his father and paternal grandfather; Pneumonia in his father.  ROS:   All other ROS reviewed and negative. Pertinent positives noted in the HPI.     EKGs/Labs/Other Studies Reviewed:    The following studies were personally reviewed by me today:  EKG:  EKG is ordered today.    EKG Interpretation Date/Time:  Wednesday June 11 2023 08:56:26 EDT Ventricular Rate:  72 PR Interval:  160 QRS Duration:  88 QT Interval:  374 QTC Calculation: 409 R Axis:   -15  Text Interpretation: Normal sinus rhythm Normal ECG Confirmed by Lennie Odor [id number]) on 06/11/2023 9:02:00 AM   TTE 08/13/2021  1. Left ventricular ejection fraction, by estimation, is 50 to 55%. The  left ventricle has low normal function. The left ventricle has no regional  wall motion abnormalities. Left ventricular diastolic parameters are  consistent with Grade I diastolic  dysfunction (impaired relaxation).   2. Right ventricular systolic function is normal. The right ventricular  size is normal.   3. The mitral valve is normal in structure. No evidence of mitral valve  regurgitation. No evidence of mitral stenosis.   4. The aortic valve is normal in structure. Aortic valve regurgitation is  not visualized. No aortic stenosis is present.   5. The inferior vena cava is normal in size with greater than 50%  respiratory variability, suggesting right atrial pressure of 3 mmHg.   Recent Labs: No results found for requested labs within last 365 days.   Recent Lipid Panel    Component Value Date/Time   CHOL 206 (H) 08/12/2021 0300   TRIG 215 (H) 08/12/2021 0300   HDL 35 (L) 08/12/2021 0300   CHOLHDL 5.9 08/12/2021 0300   VLDL 43 (H) 08/12/2021 0300   LDLCALC 128 (H) 08/12/2021 0300    Physical Exam:   VS:  BP 124/88 (BP Location: Left Arm, Patient Position: Sitting, Cuff Size: Normal)   Pulse 79   Ht 5' 9.5" (1.765 m)   Wt 232 lb 12.8 oz (105.6 kg)   SpO2 99%   BMI 33.89 kg/m    Wt Readings from Last 3 Encounters:  06/11/23 232 lb 12.8 oz (105.6 kg)  01/08/23 230 lb 9.6 oz (104.6 kg)  07/16/22 214 lb 15.2 oz (97.5 kg)    General: Well nourished, well developed, in no acute distress Head:  Atraumatic, normal size  Eyes: PEERLA, EOMI  Neck: Supple, no JVD Endocrine: No thryomegaly Cardiac: Normal S1, S2; RRR; no murmurs, rubs, or gallops Lungs: Clear to auscultation bilaterally, no wheezing, rhonchi or rales  Abd: Soft, nontender, no hepatomegaly  Ext: No edema, pulses 2+ Musculoskeletal: No deformities, BUE and BLE strength normal and equal Skin: Warm and dry, no rashes   Neuro: Alert and oriented to person, place, time, and situation, CNII-XII grossly intact, no focal deficits  Psych: Normal mood and affect   ASSESSMENT:  Juluis Stratmann is a 47 y.o. male who presents for the following: 1. Coronary artery disease of native artery of native heart with stable angina pectoris (HCC)   2. Mixed hyperlipidemia   3. Primary hypertension     PLAN:   1. Coronary artery disease of native artery of native heart with stable angina pectoris (HCC) 2. Mixed hyperlipidemia -Non-STEMI in 2022 status post PCI to the circumflex.  Has small disease in the RCA distribution which are not amendable to PCI.  Ejection fraction 50 to 55%.  No symptoms of angina today.  EKG normal in office today.  Continue aspirin 81 mg daily.  LDL 70.  Would like to get this a bit lower.  Increase Crestor to 20 mg daily.  I also would like for him to get an LP(a) when labs are drawn by his primary care physician.  We discussed proper diet and regular exercise.  Overall doing well.  3. Primary hypertension -BP stable on metoprolol.  No changes today.   Disposition: Return in about 1 year (around 06/10/2024).  Medication Adjustments/Labs and Tests Ordered: Current medicines are reviewed at length with the patient today.  Concerns regarding medicines are outlined above.  Orders Placed This Encounter  Procedures   Lipoprotein A (LPA)   EKG 12-Lead   EKG 12-Lead   Meds ordered this encounter  Medications   nitroGLYCERIN (NITROSTAT) 0.4 MG SL tablet    Sig: Place 1 tablet (0.4 mg total) under the tongue  every 5 (five) minutes as needed for chest pain.    Dispense:  25 tablet    Refill:  3    Dr. Eather Colas patient is taking cialis   rosuvastatin (CRESTOR) 20 MG tablet    Sig: Take 1 tablet (20 mg total) by mouth daily.    Dispense:  90 tablet    Refill:  3   Patient Instructions  Medication Instructions:  Your physician has recommended you make the following change in your medication:  Rosuvastatin (Crestor), 20mg  once daily.   *If you need a refill on your cardiac medications before your next appointment, please call your pharmacy*   Lab Work: Lpa at next primary visit lab draw      Follow-Up: At [facility], you and your health needs are our priority.  As part of our continuing mission to provide you with exceptional heart care, we have created designated Provider Care Teams.  These Care Teams include your primary Cardiologist (physician) and Advanced Practice Providers (APPs -  Physician Assistants and Nurse Practitioners) who all work together to provide you with the care you need, when you need it.  We recommend signing up for the patient portal called "MyChart".  Sign up information is provided on this After Visit Summary.  MyChart is used to connect with patients for Virtual Visits (Telemedicine).  Patients are able to view lab/test results, encounter notes, upcoming appointments, etc.  Non-urgent messages can be sent to your provider as well.   To learn more about what you can do with MyChart, go to [email or web address].    Your next appointment:   1 year(s)  Provider:   Reatha Harps, MD        Time Spent with Patient: I have spent a total of 35 minutes with patient reviewing hospital notes, telemetry, EKGs, labs and examining the patient as well as establishing an assessment and plan that was discussed with the patient.  > 50% of time was spent in direct patient care.  Signed,  Gerri Spore T. Flora Lipps, MD, [facility]  [facility]  [address]  [address] [place], [place] [address] [phone]  06/11/2023 9:38 AM

## 2023-06-11 ENCOUNTER — Ambulatory Visit: Payer: 59 | Attending: Cardiovascular Disease | Admitting: Cardiovascular Disease

## 2023-06-11 ENCOUNTER — Encounter: Payer: Self-pay | Admitting: Cardiovascular Disease

## 2023-06-11 VITALS — BP 124/88 | HR 79 | Ht 69.5 in | Wt 232.8 lb

## 2023-06-11 DIAGNOSIS — I10 Essential (primary) hypertension: Secondary | ICD-10-CM | POA: Diagnosis not present

## 2023-06-11 DIAGNOSIS — E782 Mixed hyperlipidemia: Secondary | ICD-10-CM | POA: Diagnosis not present

## 2023-06-11 DIAGNOSIS — I25118 Atherosclerotic heart disease of native coronary artery with other forms of angina pectoris: Secondary | ICD-10-CM

## 2023-06-11 DIAGNOSIS — Z720 Tobacco use: Secondary | ICD-10-CM

## 2023-06-11 MED ORDER — NITROGLYCERIN 0.4 MG SL SUBL: 0.4000 mg | SUBLINGUAL_TABLET | SUBLINGUAL | 3 refills | Status: AC | PRN

## 2023-06-11 MED ORDER — ROSUVASTATIN CALCIUM 20 MG PO TABS: 20.0000 mg | ORAL_TABLET | Freq: Every day | ORAL | 3 refills | Status: DC

## 2023-06-11 NOTE — Patient Instructions (Signed)
Medication Instructions:  Your physician has recommended you make the following change in your medication:  Rosuvastatin (Crestor), 20mg  once daily.   *If you need a refill on your cardiac medications before your next appointment, please call your pharmacy*   Lab Work: Lpa at next primary visit lab draw      Follow-Up: At [facility], you and your health needs are our priority.  As part of our continuing mission to provide you with exceptional heart care, we have created designated Provider Care Teams.  These Care Teams include your primary Cardiologist (physician) and Advanced Practice Providers (APPs -  Physician Assistants and Nurse Practitioners) who all work together to provide you with the care you need, when you need it.  We recommend signing up for the patient portal called "MyChart".  Sign up information is provided on this After Visit Summary.  MyChart is used to connect with patients for Virtual Visits (Telemedicine).  Patients are able to view lab/test results, encounter notes, upcoming appointments, etc.  Non-urgent messages can be sent to your provider as well.   To learn more about what you can do with MyChart, go to [email or web address].    Your next appointment:   1 year(s)  Provider:   Reatha Harps, MD

## 2023-06-12 ENCOUNTER — Encounter (INDEPENDENT_AMBULATORY_CARE_PROVIDER_SITE_OTHER): Payer: Self-pay

## 2023-07-01 ENCOUNTER — Other Ambulatory Visit: Payer: Self-pay | Admitting: Cardiovascular Disease

## 2023-07-02 ENCOUNTER — Telehealth: Payer: Self-pay | Admitting: Cardiovascular Disease

## 2023-07-02 NOTE — Telephone Encounter (Signed)
Pt's medication was sent to pt's pharmacy as requested. Confirmation received.  °

## 2023-07-02 NOTE — Telephone Encounter (Signed)
*  STAT* If patient is at the pharmacy, call can be transferred to refill team.   1. Which medications need to be refilled? (please list name of each medication and dose if known)   metoprolol tartrate (LOPRESSOR) 25 MG tablet     2. Would you like to learn more about the convenience, safety, & potential cost savings by using the [facility]? No    3. Are you open to using the [facility] (Type [facility]. ) No   4. Which pharmacy/location (including street and city if local pharmacy) is medication to be sent to?  [facility] #[id number] - [place], [place] - [address] AT [facility] [address]     5. Do they need a 30 day or 90 day supply? 90 day   Pt was seen on 06/11/23

## 2023-07-15 ENCOUNTER — Encounter: Payer: Self-pay | Admitting: Physician Assistant

## 2023-07-15 ENCOUNTER — Ambulatory Visit (INDEPENDENT_AMBULATORY_CARE_PROVIDER_SITE_OTHER): Payer: 59 | Admitting: Physician Assistant

## 2023-07-15 VITALS — BP 130/78 | HR 74 | Temp 97.5°F | Wt 239.2 lb

## 2023-07-15 DIAGNOSIS — Z1211 Encounter for screening for malignant neoplasm of colon: Secondary | ICD-10-CM

## 2023-07-15 DIAGNOSIS — Z0000 Encounter for general adult medical examination without abnormal findings: Secondary | ICD-10-CM

## 2023-07-15 DIAGNOSIS — Z131 Encounter for screening for diabetes mellitus: Secondary | ICD-10-CM | POA: Diagnosis not present

## 2023-07-15 DIAGNOSIS — Z13220 Encounter for screening for lipoid disorders: Secondary | ICD-10-CM | POA: Diagnosis not present

## 2023-07-15 LAB — LIPID PANEL
Cholesterol: 144 mg/dL (ref 0–200)
HDL: 39.9 mg/dL (ref >39.00)
LDL Cholesterol: 84 mg/dL (ref 0–99)
NonHDL: 104.25
Total CHOL/HDL Ratio: 4
Triglycerides: 103 mg/dL (ref 0.0–149.0)
VLDL: 20.6 mg/dL (ref 0.0–40.0)

## 2023-07-15 LAB — COMPREHENSIVE METABOLIC PANEL
ALT: 36 U/L (ref 0–53)
AST: 24 U/L (ref 0–37)
Albumin: 4 g/dL (ref 3.5–5.2)
Alkaline Phosphatase: 81 U/L (ref 39–117)
BUN: 15 mg/dL (ref 6–23)
CO2: 27 meq/L (ref 19–32)
Calcium: 9.1 mg/dL (ref 8.4–10.5)
Chloride: 106 meq/L (ref 96–112)
Creatinine, Ser: 0.83 mg/dL (ref 0.40–1.50)
GFR: 105.71 mL/min (ref >60.00)
Glucose, Bld: 148 mg/dL — ABNORMAL HIGH (ref 70–99)
Potassium: 4.7 meq/L (ref 3.5–5.1)
Sodium: 139 meq/L (ref 135–145)
Total Bilirubin: 0.6 mg/dL (ref 0.2–1.2)
Total Protein: 6.9 g/dL (ref 6.0–8.3)

## 2023-07-15 LAB — CBC WITH DIFFERENTIAL/PLATELET
Basophils Absolute: 0.1 10*3/uL (ref 0.0–0.1)
Basophils Relative: 1 % (ref 0.0–3.0)
Eosinophils Absolute: 0.4 10*3/uL (ref 0.0–0.7)
Eosinophils Relative: 4.7 % (ref 0.0–5.0)
HCT: 48.2 % (ref 39.0–52.0)
Hemoglobin: 16.3 g/dL (ref 13.0–17.0)
Lymphocytes Relative: 26.7 % (ref 12.0–46.0)
Lymphs Abs: 2.1 10*3/uL (ref 0.7–4.0)
MCHC: 33.8 g/dL (ref 30.0–36.0)
MCV: 87.8 fl (ref 78.0–100.0)
Monocytes Absolute: 0.6 10*3/uL (ref 0.1–1.0)
Monocytes Relative: 7.9 % (ref 3.0–12.0)
Neutro Abs: 4.6 10*3/uL (ref 1.4–7.7)
Neutrophils Relative %: 59.7 % (ref 43.0–77.0)
Platelets: 210 10*3/uL (ref 150.0–400.0)
RBC: 5.5 Mil/uL (ref 4.22–5.81)
RDW: 12.8 % (ref 11.5–15.5)
WBC: 7.7 10*3/uL (ref 4.0–10.5)

## 2023-07-15 LAB — TSH: TSH: 1.97 u[IU]/mL (ref 0.35–5.50)

## 2023-07-15 LAB — HEMOGLOBIN A1C: Hgb A1c MFr Bld: 6.5 % (ref 4.6–6.5)

## 2023-07-15 NOTE — Patient Instructions (Addendum)
Consider Repatha in addition to your Crestor - I can reach out to your cardiologist in regard to this as well once labs are back.   Keep up good work on health goals.

## 2023-07-15 NOTE — Progress Notes (Signed)
Subjective:    Patient ID: Kevin Lloyd, male    DOB: [birth year], 47 y.o.   MRN: [id number]  Chief Complaint  Patient presents with   Annual Exam    Pt saw Cardiology    HPI Patient is in today for annual exam.  Health maintenance: Lifestyle/ exercise: Exercise through work  Nutrition: working on improving  Mental health: no issues  Substance use: vaping (45ish puffs per day approx, equivalent to maybe 5 cigs per day), cutting back  Sexual activity: monogamous, declines testing  Immunizations: Declines flu vaccine  Colonoscopy: Cologuard  Skin: no new lesions or rashes    Past Medical History:  Diagnosis Date   Acid reflux    Chest pain 08/11/2021   Coronary artery disease    Hyperlipidemia    Kidney stone    NSTEMI (non-ST elevated myocardial infarction) (HCC) 08/11/2021   Vertigo     Past Surgical History:  Procedure Laterality Date   CARDIAC CATHETERIZATION     CORONARY STENT INTERVENTION N/A 08/13/2021   Procedure: CORONARY STENT INTERVENTION;  Surgeon: Tonny Bollman, MD;  Location: [facility] INVASIVE CV LAB;  Service: Cardiovascular;  Laterality: N/A;   LEFT HEART CATH AND CORONARY ANGIOGRAPHY N/A 08/13/2021   Procedure: LEFT HEART CATH AND CORONARY ANGIOGRAPHY;  Surgeon: Tonny Bollman, MD;  Location: [facility] INVASIVE CV LAB;  Service: Cardiovascular;  Laterality: N/A;   WISDOM TOOTH EXTRACTION     WISDOM TOOTH EXTRACTION      Family History  Problem Relation Age of Onset   Heart attack Father    Pneumonia Father        passed from this   Cirrhosis Father    Diabetes Paternal Grandmother    Heart attack Paternal Grandfather     Social History   Tobacco Use   Smoking status: Former    Current packs/day: 0.00    Types: Cigarettes    Quit date: 08/11/2021    Years since quitting: 1.9   Smokeless tobacco: Never  Vaping Use   Vaping status: Never Used  Substance Use Topics   Alcohol use: Yes    Comment: rare   Drug use: No     Allergies  Allergen  Reactions   Azithromycin Other (See Comments)    Caused his tongue to breakout with bumps    Review of Systems NEGATIVE UNLESS OTHERWISE INDICATED IN HPI      Objective:     BP 130/78   Pulse 74   Temp (!) 97.5 F (36.4 C) (Temporal)   Wt 239 lb 3.2 oz (108.5 kg)   SpO2 94%   BMI 34.82 kg/m   Wt Readings from Last 3 Encounters:  07/15/23 239 lb 3.2 oz (108.5 kg)  06/11/23 232 lb 12.8 oz (105.6 kg)  01/08/23 230 lb 9.6 oz (104.6 kg)    BP Readings from Last 3 Encounters:  07/15/23 130/78  06/11/23 124/88  01/08/23 115/77     Physical Exam Vitals and nursing note reviewed.  Constitutional:      General: He is not in acute distress.    Appearance: Normal appearance. He is not toxic-appearing.  HENT:     Head: Normocephalic and atraumatic.     Right Ear: Tympanic membrane, ear canal and external ear normal.     Left Ear: Tympanic membrane, ear canal and external ear normal.     Nose: Nose normal.     Mouth/Throat:     Mouth: Mucous membranes are moist.     Pharynx: Oropharynx is  clear.  Eyes:     Extraocular Movements: Extraocular movements intact.     Conjunctiva/sclera: Conjunctivae normal.     Pupils: Pupils are equal, round, and reactive to light.  Cardiovascular:     Rate and Rhythm: Normal rate and regular rhythm.     Pulses: Normal pulses.     Heart sounds: Normal heart sounds.  Pulmonary:     Effort: Pulmonary effort is normal.     Breath sounds: Normal breath sounds.  Abdominal:     General: Abdomen is flat. Bowel sounds are normal.     Palpations: Abdomen is soft.     Tenderness: There is no abdominal tenderness.  Musculoskeletal:        General: Normal range of motion.     Cervical back: Normal range of motion and neck supple.  Skin:    General: Skin is warm and dry.     Comments: Few macular benign nevi on back; one on L anterior chest - all unchanged per patient  Neurological:     General: No focal deficit present.     Mental Status: He  is alert and oriented to person, place, and time.  Psychiatric:        Mood and Affect: Mood normal.        Behavior: Behavior normal.        Assessment & Plan:  Encounter for annual physical exam -     Lipoprotein A (LPA) -     Lipid panel -     TSH -     Comprehensive metabolic panel -     CBC with Differential/Platelet -     Hemoglobin A1c -     Cologuard  Screening for colon cancer -     Cologuard    Age-appropriate screening and counseling performed today. Will check labs and call with results. Advised cessation of vaping. Cologuard ordered.  Preventive measures discussed and printed in AVS for patient.   Patient Counseling: [x]   Nutrition: Stressed importance of moderation in sodium/caffeine intake, saturated fat and cholesterol, caloric balance, sufficient intake of fresh fruits, vegetables, and fiber.  [x]   Stressed the importance of regular exercise.   [x]   Substance Abuse: Discussed cessation/primary prevention of tobacco, alcohol, or other drug use; driving or other dangerous activities under the influence; availability of treatment for abuse.   []   Injury prevention: Discussed safety belts, safety helmets, smoke detector, smoking near bedding or upholstery.   []   Sexuality: Discussed sexually transmitted diseases, partner selection, use of condoms, avoidance of unintended pregnancy  and contraceptive alternatives.   [x]   Dental health: Discussed importance of regular tooth brushing, flossing, and dental visits.  [x]   Health maintenance and immunizations reviewed. Please refer to Health maintenance section.        Return in about 1 year (around 07/14/2024) for physical, fasting labs.     Latiya Navia M Marquitta Persichetti, PA-C

## 2023-07-19 LAB — LIPOPROTEIN A (LPA): Lipoprotein (a): 143 nmol/L — ABNORMAL HIGH (ref <75)

## 2023-07-24 ENCOUNTER — Telehealth: Payer: Self-pay | Admitting: Physician Assistant

## 2023-07-24 NOTE — Telephone Encounter (Signed)
Patient requests to be called to discuss Lab results received on MyChart

## 2023-07-25 ENCOUNTER — Other Ambulatory Visit: Payer: Self-pay

## 2023-07-25 MED ORDER — ROSUVASTATIN CALCIUM 40 MG PO TABS: 40.0000 mg | ORAL_TABLET | Freq: Every day | ORAL | 1 refills | Status: DC

## 2023-07-25 NOTE — Telephone Encounter (Signed)
Please see pt request for call to discuss message left by nurse regarding recent labs. Message below....  Called left message for  patient. He  will need increase Rosuvastatin to 40 mg daily   RN reviewed patient's chart , he has a call to primary to discuss results.  Rn awaiting to see if primary will send in new Rx or cardiology needs too. RN did leave message for patient double the 20 mg tablet equal 40 mg.

## 2023-07-25 NOTE — Telephone Encounter (Signed)
Called pt to advise Rx sent to pharmacy. Pt stated that at last visit PCP was supposed to speak with Cardiologist due to 20g dose causing severe muscle pains/cramps. Pt states he doesn't want to not take medication and have further issues however concerned with possible side effects getting worse. Please advise

## 2023-07-29 NOTE — Telephone Encounter (Signed)
Noted and agreed, thank you. 

## 2023-07-29 NOTE — Telephone Encounter (Signed)
Called pt and advised recommendations and patient very hesitant. Pt states he pushed through the cramps taking Stating and now is doing fine. Scheduled MyChart VV with PCP to discuss further Monday 08/04/23 @ 11am

## 2023-08-04 ENCOUNTER — Telehealth (INDEPENDENT_AMBULATORY_CARE_PROVIDER_SITE_OTHER): Payer: 59 | Admitting: Physician Assistant

## 2023-08-04 ENCOUNTER — Encounter: Payer: Self-pay | Admitting: Physician Assistant

## 2023-08-04 DIAGNOSIS — E119 Type 2 diabetes mellitus without complications: Secondary | ICD-10-CM

## 2023-08-04 DIAGNOSIS — Z7984 Long term (current) use of oral hypoglycemic drugs: Secondary | ICD-10-CM | POA: Diagnosis not present

## 2023-08-04 DIAGNOSIS — E782 Mixed hyperlipidemia: Secondary | ICD-10-CM

## 2023-08-04 NOTE — Progress Notes (Unsigned)
   Virtual Visit via Video Note  I connected with  Kevin Lloyd  on 08/05/23 at 11:00 AM EDT by a video enabled telemedicine application and verified that I am speaking with the correct person using two identifiers.  Location: Patient: home Provider: Nature conservation officer at [employer] Persons present: Patient and myself   I discussed the limitations of evaluation and management by telemedicine and the availability of in person appointments. The patient expressed understanding and agreed to proceed.   History of Present Illness:  47 yo male presenting for VV to discuss diabetes and cholesterol plans.  Pt had myalgias at first with the increase, but now tolerating Crestor 40 mg at this time, would like to continue this instead of switching to Repatha. Newly diagnosed diabetes, not currently on an medication regimen.  Trying to cut back on breads, carbs, sugars.  Switching to cauliflower crust pizza (previously had a lot of carbs from pizzas). No symptoms or concerns at this time.     Observations/Objective:   Gen: Awake, alert, no acute distress Resp: Breathing is even and non-labored Psych: calm/pleasant demeanor Neuro: Alert and Oriented x 3, + facial symmetry, speech is clear.   Assessment and Plan:  Problem List Items Addressed This Visit       Endocrine   New onset type 2 diabetes mellitus (HCC) - Primary    Lab Results  Component Value Date   HGBA1C 6.5 07/15/2023   HGBA1C 6.2 (H) 08/13/2021   Just barely in diabetic range. I think he can make great changes with lifestyle. Referral to nutritionist to help with details. Consider Metformin, we discussed this today, reassess in 3-4 month.       Relevant Orders   Amb Referral to Nutrition and Diabetic Education     Other   Hyperlipidemia    Lab Results  Component Value Date   CHOL 144 07/15/2023   HDL 39.90 07/15/2023   LDLCALC 84 07/15/2023   TRIG 103.0 07/15/2023   CHOLHDL 4 07/15/2023   Goal  LDL <70 Cont Crestor 40 mg at this time as he's tolerating well Declined Repatha due to fear of needles        Follow Up Instructions:    I discussed the assessment and treatment plan with the patient. The patient was provided an opportunity to ask questions and all were answered. The patient agreed with the plan and demonstrated an understanding of the instructions.   The patient was advised to call back or seek an in-person evaluation if the symptoms worsen or if the condition fails to improve as anticipated.  Alcides Nutting M Eashan Schipani, PA-C

## 2023-08-05 DIAGNOSIS — E119 Type 2 diabetes mellitus without complications: Secondary | ICD-10-CM | POA: Insufficient documentation

## 2023-08-05 NOTE — Assessment & Plan Note (Signed)
Lab Results  Component Value Date   CHOL 144 07/15/2023   HDL 39.90 07/15/2023   LDLCALC 84 07/15/2023   TRIG 103.0 07/15/2023   CHOLHDL 4 07/15/2023   Goal LDL <70 Cont Crestor 40 mg at this time as he's tolerating well Declined Repatha due to fear of needles

## 2023-08-05 NOTE — Assessment & Plan Note (Signed)
Lab Results  Component Value Date   HGBA1C 6.5 07/15/2023   HGBA1C 6.2 (H) 08/13/2021   Just barely in diabetic range. I think he can make great changes with lifestyle. Referral to nutritionist to help with details. Consider Metformin, we discussed this today, reassess in 3-4 month.

## 2023-08-11 LAB — COLOGUARD: COLOGUARD: NEGATIVE

## 2023-09-22 ENCOUNTER — Encounter: Payer: 59 | Attending: Physician Assistant | Admitting: Dietician

## 2023-09-22 DIAGNOSIS — Z713 Dietary counseling and surveillance: Secondary | ICD-10-CM | POA: Insufficient documentation

## 2023-09-22 DIAGNOSIS — E119 Type 2 diabetes mellitus without complications: Secondary | ICD-10-CM | POA: Insufficient documentation

## 2023-09-22 NOTE — Progress Notes (Signed)
Diabetes Self-Management Education  Visit Type: First/Initial  Appt. Start Time: 1005 Appt. End Time: 11:20  09/22/2023  Mr. Kevin Lloyd, identified by name and date of birth, is a 47 y.o. male with a diagnosis of Diabetes: Type 2.   ASSESSMENT  Patient is here today alone. Patient would like to learn about dietary intake what is ok to eat for health and more information about using nutrition labels. Patient lives with his fiance. Pt reports his fiance does the majority of the shopping and cooking. Pt reports making the following changes most recently including now selecting smart balance, increased non starchy vegetable intake, decreased sodium intake, quit smoking and walking. Pt is interested in CGM products.   History includes:   Past Medical History:  Diagnosis Date   Acid reflux    Chest pain 08/11/2021   Coronary artery disease    Hyperlipidemia    Kidney stone    NSTEMI (non-ST elevated myocardial infarction) (HCC) 08/11/2021   Vertigo     Labs noted:   Lab Results  Component Value Date   HGBA1C 6.5 07/15/2023   Lab Results  Component Value Date   CHOL 144 07/15/2023   HDL 39.90 07/15/2023   LDLCALC 84 07/15/2023   TRIG 103.0 07/15/2023   CHOLHDL 4 07/15/2023   BP Readings from Last 3 Encounters:  07/15/23 130/78  06/11/23 124/88  01/08/23 115/77    Medications include:   Current Outpatient Medications:    aspirin 81 MG EC tablet, Take 1 tablet (81 mg total) by mouth daily. Swallow whole., Disp: 90 tablet, Rfl: 3   lansoprazole (PREVACID) 15 MG capsule, Take 15 mg by mouth every evening., Disp: , Rfl:    metoprolol tartrate (LOPRESSOR) 25 MG tablet, TAKE 1 TABLET(25 MG) BY MOUTH TWICE DAILY, Disp: 180 tablet, Rfl: 3   nitroGLYCERIN (NITROSTAT) 0.4 MG SL tablet, Place 1 tablet (0.4 mg total) under the tongue every 5 (five) minutes as needed for chest pain., Disp: 25 tablet, Rfl: 3   rosuvastatin (CRESTOR) 40 MG tablet, Take 1 tablet (40 mg total) by mouth  at bedtime., Disp: 90 tablet, Rfl: 1   tadalafil (CIALIS) 10 MG tablet, Take 1 tablet (10 mg total) by mouth daily as needed for erectile dysfunction., Disp: 30 tablet, Rfl: 11   acetaminophen (TYLENOL) 325 MG tablet, Take 2 tablets (650 mg total) by mouth every 6 (six) hours as needed. (Patient not taking: Reported on 06/11/2023), Disp: 36 tablet, Rfl: 0   Ascorbic Acid (VITAMIN C ADULT GUMMIES PO), Take 4 tablets by mouth every evening. (Patient not taking: Reported on 06/11/2023), Disp: , Rfl:     There were no vitals taken for this visit. There is no height or weight on file to calculate BMI.   Diabetes Self-Management Education - 09/22/23 1013       Visit Information   Visit Type First/Initial      Initial Visit   Diabetes Type Type 2    Date Diagnosed 2024    Are you currently following a meal plan? No    Are you taking your medications as prescribed? Not on Medications      Health Coping   How would you rate your overall health? Fair      Psychosocial Assessment   Patient Belief/Attitude about Diabetes --   provider assessment =afraid   What is the hardest part about your diabetes right now, causing you the most concern, or is the most worrisome to you about your diabetes?  Making healty food and beverage choices    Self-care barriers None    Self-management support Doctor's office    Other persons present Patient    Patient Concerns Nutrition/Meal planning;Healthy Lifestyle;Problem Solving;Glycemic Control;Weight Control;Support    Special Needs None    Preferred Learning Style No preference indicated    Learning Readiness Not Ready    How often do you need to have someone help you when you read instructions, pamphlets, or other written materials from your doctor or pharmacy? 1 - Never    What is the last grade level you completed in school? GED      Pre-Education Assessment   Patient understands the diabetes disease and treatment process. Needs Instruction    Patient  understands incorporating nutritional management into lifestyle. Needs Instruction    Patient undertands incorporating physical activity into lifestyle. Needs Instruction    Patient understands using medications safely. Needs Instruction    Patient understands monitoring blood glucose, interpreting and using results Needs Instruction    Patient understands prevention, detection, and treatment of acute complications. Needs Instruction    Patient understands prevention, detection, and treatment of chronic complications. Needs Instruction    Patient understands how to develop strategies to address psychosocial issues. Needs Instruction    Patient understands how to develop strategies to promote health/change behavior. Needs Instruction      Complications   Last HgB A1C per patient/outside source 6.5 %    How often do you check your blood sugar? 0 times/day (not testing)    Number of hyperglycemic episodes ( >200mg /dL): Never    Can you tell when your blood sugar is high? No    Have you had a dilated eye exam in the past 12 months? No    Have you had a dental exam in the past 12 months? No    Are you checking your feet? No      Dietary Intake   Breakfast skips or sausage biscuit, SF propel, coffee with 3-4 teaspoons sugar and coffee mate peppermint mocha creamer    Lunch ~ 1:30-2p: cheese burger on a bun, ketchup and mustard french fries, no salt (cook out) coke zero    Snack (afternoon) sometimes popcorn or rice chips    Dinner noodles, alfedo, chicken, water    Beverage(s) SF propel, zero coke, water      Activity / Exercise   Activity / Exercise Type ADL's      Patient Education   Previous Diabetes Education No    Disease Pathophysiology Definition of diabetes, type 1 and 2, and the diagnosis of diabetes    Healthy Eating Role of diet in the treatment of diabetes and the relationship between the three main macronutrients and blood glucose level;Food label reading, portion sizes and  measuring food.;Plate Method;Reviewed blood glucose goals for pre and post meals and how to evaluate the patients' food intake on their blood glucose level.    Being Active Role of exercise on diabetes management, blood pressure control and cardiac health.    Medications Other (comment)   no diabetes meds per chart   Monitoring Identified appropriate SMBG and/or A1C goals.    Acute complications Other (comment)    Chronic complications Lipid levels, blood glucose control and heart disease;Relationship between chronic complications and blood glucose control    Diabetes Stress and Support Identified and addressed patients feelings and concerns about diabetes;Role of stress on diabetes;Worked with patient to identify barriers to care and solutions;Brainstormed with patient on coping mechanisms for  social situations, getting support from significant others, dealing with feelings about diabetes    Lifestyle and Health Coping Lifestyle issues that need to be addressed for better diabetes care      Individualized Goals (developed by patient)   Nutrition Follow meal plan discussed    Medications Not Applicable    Problem Solving Eating Pattern    Reducing Risk do foot checks daily;Other (comment)   consider CGM or SMBG   Health Coping Ask for help with psychological, social, or emotional issues      Post-Education Assessment   Patient understands the diabetes disease and treatment process. Needs Review    Patient understands incorporating nutritional management into lifestyle. Needs Review    Patient undertands incorporating physical activity into lifestyle. Comprehends key points    Patient understands using medications safely. Needs Instruction    Patient understands monitoring blood glucose, interpreting and using results Needs Review    Patient understands prevention, detection, and treatment of acute complications. Needs Review    Patient understands prevention, detection, and treatment of  chronic complications. Needs Review    Patient understands how to develop strategies to address psychosocial issues. Needs Review    Patient understands how to develop strategies to promote health/change behavior. Needs Review      Outcomes   Expected Outcomes Demonstrated interest in learning. Expect positive outcomes    Future DMSE 2 months    Program Status Not Completed             Individualized Plan for Diabetes Self-Management Training:   Learning Objective:  Patient will have a greater understanding of diabetes self-management. Patient education plan is to attend individual and/or group sessions per assessed needs and concerns.   Plan:   Patient Instructions  1- Plate planner for meal planning and portions  1/2 non starchy vegetables, 1/4 protein, 1/4 carbohydrate 2-Walk 30 minutes 3-5 days weekly Great job! Continue.    Expected Outcomes:  Demonstrated interest in learning. Expect positive outcomes  Education material provided: ADA - How to Thrive: A Guide for Your Journey with Diabetes, Food label handouts, My Plate, Snack sheet, and Diabetes Resources  If problems or questions, patient to contact team via:  Phone  Future DSME appointment: 2 months

## 2023-09-22 NOTE — Patient Instructions (Addendum)
1- Plate planner for meal planning and portions  1/2 non starchy vegetables, 1/4 protein, 1/4 carbohydrate 2-Walk 30 minutes 3-5 days weekly Great job! Continue.

## 2023-11-11 ENCOUNTER — Other Ambulatory Visit: Payer: Self-pay | Admitting: Cardiovascular Disease

## 2023-12-11 ENCOUNTER — Encounter: Payer: Self-pay | Admitting: Physician Assistant

## 2023-12-11 ENCOUNTER — Ambulatory Visit: Payer: 59 | Admitting: Physician Assistant

## 2023-12-11 VITALS — BP 140/90 | HR 80 | Temp 99.0°F | Ht 69.5 in | Wt 228.4 lb

## 2023-12-11 DIAGNOSIS — F410 Panic disorder [episodic paroxysmal anxiety] without agoraphobia: Secondary | ICD-10-CM | POA: Diagnosis not present

## 2023-12-11 DIAGNOSIS — R079 Chest pain, unspecified: Secondary | ICD-10-CM

## 2023-12-11 DIAGNOSIS — M24572 Contracture, left ankle: Secondary | ICD-10-CM | POA: Insufficient documentation

## 2023-12-11 DIAGNOSIS — F439 Reaction to severe stress, unspecified: Secondary | ICD-10-CM | POA: Diagnosis not present

## 2023-12-11 MED ORDER — HYDROXYZINE HCL 25 MG PO TABS
ORAL_TABLET | ORAL | 0 refills | Status: AC
Start: 2023-12-11 — End: ?

## 2023-12-11 NOTE — Patient Instructions (Signed)
Call your cardiologist  Let us know about FMLA paperwork  Try breathing techniques Follow with counseling  Take hydroxyzine as directed if needed for acute panic

## 2023-12-11 NOTE — Progress Notes (Signed)
Patient ID: Kevin Lloyd, male    DOB: 1976/09/11, 48 y.o.   MRN: [id number]   Assessment & Plan:  Chest pain, unspecified type -     EKG 12-Lead  Stress -     hydrOXYzine HCl; Take 1/2 to 1 tab every 6-8 hours as needed for acute anxiety or insomnia.  Dispense: 30 tablet; Refill: 0  Panic attack -     hydrOXYzine HCl; Take 1/2 to 1 tab every 6-8 hours as needed for acute anxiety or insomnia.  Dispense: 30 tablet; Refill: 0    Assessment and Plan    Panic Disorder Recent onset of panic attacks, six episodes in the last three weeks, disrupting work and personal life. No prior history of anxiety or panic disorders. Patient has scheduled an appointment with a mental health professional. -Encourage continued engagement with mental health services. -Prescribe Hydroxyzine for acute episodes, with caution regarding its sedative effects and potential impact on work. -Consider stress management techniques and counseling.  Post Myocardial Infarction Anxiety Patient has a history of a heart attack in October 2022 and is now experiencing heightened awareness of cardiac sensations, leading to panic attacks. EKG performed during the visit was normal (and compared to prior EKG in 05/2023). -Contact patient's cardiologist to discuss recent symptoms and ensure cardiac health. -Continue to reassure patient about cardiac health and differentiate between panic symptoms and cardiac symptoms.  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 -Consider FMLA paperwork for temporary work accommodation while patient is managing panic disorder.          Return in about 2 months (around 02/08/2024) for recheck/follow-up.    Subjective:    Chief Complaint  Patient presents with   Panic Attack    Patient is present for Panic Attacks. Patient states has had four bad panic attacks. Starts out at his job chest has some tightness  feeling. Had a heart attack 2 years ago. Brings back memories of having heart attack. Past three weeks this has been going on.      History of Present Illness   Kevin Lloyd is a 48 year old male with a history of myocardial infarction who presents with recent panic attacks.  He has experienced six panic attacks over the past three weeks, with the first being severe and subsequent ones being moderate to mild. The initial attack occurred while he was on his lift at work, feeling a tightness that he initially dismissed. Over time, he became convinced he was having another heart attack, leading to hyperventilation and an elevated heart rate. Emergency services were called, but no cardiac issues were found.  These panic attacks have disrupted his work, requiring first responders to assist him until he can function properly. 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 He has an appointment with the [facility] to discuss these episodes further.  He has a history of a heart attack in October 2022, which has been a recent topic of conversation with his girlfriend's daughter, possibly contributing to his anxiety. He has not experienced the same chest pain as during his heart attack, describing current chest sensations as 'superficial' and likely related to bone or muscle tissue.  He has not previously dealt with stress, anxiety, or depression medically, though he acknowledges discomfort in crowded situations. He has not contacted his cardiologist regarding these recent episodes but is considering it. He has been  feeling his heartbeat throughout his body when lying down, which he attributes to stress.  He has made lifestyle changes, including dietary adjustments, but feels he may not be doing enough.       Past Medical History:  Diagnosis Date   Acid reflux    Chest pain 08/11/2021   Coronary artery disease     Hyperlipidemia    Kidney stone    NSTEMI (non-ST elevated myocardial infarction) (HCC) 08/11/2021   Vertigo     Past Surgical History:  Procedure Laterality Date   CARDIAC CATHETERIZATION     CORONARY STENT INTERVENTION N/A 08/13/2021   Procedure: CORONARY STENT INTERVENTION;  Surgeon: Tonny Bollman, MD;  Location: [facility] INVASIVE CV LAB;  Service: Cardiovascular;  Laterality: N/A;   LEFT HEART CATH AND CORONARY ANGIOGRAPHY N/A 08/13/2021   Procedure: LEFT HEART CATH AND CORONARY ANGIOGRAPHY;  Surgeon: Tonny Bollman, MD;  Location: [facility] INVASIVE CV LAB;  Service: Cardiovascular;  Laterality: N/A;   WISDOM TOOTH EXTRACTION     WISDOM TOOTH EXTRACTION      Family History  Problem Relation Age of Onset   Heart attack Father    Pneumonia Father        passed from this   Cirrhosis Father    Diabetes Paternal Grandmother    Heart attack Paternal Grandfather     Social History   Tobacco Use   Smoking status: Former    Current packs/day: 0.00    Types: Cigarettes    Quit date: 08/11/2021    Years since quitting: 2.3   Smokeless tobacco: Never  Vaping Use   Vaping status: Never Used  Substance Use Topics   Alcohol use: Yes    Comment: rare   Drug use: No     Allergies  Allergen Reactions   Azithromycin Other (See Comments)    Caused his tongue to breakout with bumps  Other reaction(s): Other (See Comments) Caused his tongue to breakout with bumps    Caused his tongue to breakout with bumps    Review of Systems NEGATIVE UNLESS OTHERWISE INDICATED IN HPI      Objective:     BP (!) 140/90 (BP Location: Left Arm, Patient Position: Sitting)   Pulse 80   Temp 99 F (37.2 C) (Temporal)   Ht 5' 9.5" (1.765 m)   Wt 228 lb 6.4 oz (103.6 kg)   SpO2 98%   BMI 33.25 kg/m   Wt Readings from Last 3 Encounters:  12/11/23 228 lb 6.4 oz (103.6 kg)  07/15/23 239 lb 3.2 oz (108.5 kg)  06/11/23 232 lb 12.8 oz (105.6 kg)    BP Readings from Last 3 Encounters:  12/11/23  (!) 140/90  07/15/23 130/78  06/11/23 124/88     Physical Exam Vitals and nursing note reviewed.  Constitutional:      Appearance: Normal appearance.  Eyes:     Extraocular Movements: Extraocular movements intact.     Conjunctiva/sclera: Conjunctivae normal.     Pupils: Pupils are equal, round, and reactive to light.  Cardiovascular:     Rate and Rhythm: Normal rate and regular rhythm.     Pulses: Normal pulses.     Heart sounds: Normal heart sounds. No murmur heard. Pulmonary:     Effort: Pulmonary effort is normal.     Breath sounds: Normal breath sounds.  Musculoskeletal:     Right lower leg: No edema.     Left lower leg: No edema.  Skin:    Findings: No lesion or rash.  Neurological:     General: No focal deficit present.     Mental Status: He is alert and oriented to person, place, and time.  Psychiatric:        Mood and Affect: Mood normal.        Behavior: Behavior normal.     Comments: Worried-appearing       Braylyn Kalter M Ambrosia Wisnewski, PA-C

## 2023-12-15 ENCOUNTER — Telehealth: Payer: Self-pay | Admitting: Physician Assistant

## 2023-12-15 ENCOUNTER — Ambulatory Visit: Payer: 59 | Admitting: Physician Assistant

## 2023-12-15 DIAGNOSIS — Z0279 Encounter for issue of other medical certificate: Secondary | ICD-10-CM

## 2023-12-15 NOTE — Telephone Encounter (Signed)
Patient dropped off document intermediate FMLA, to be filled out by provider. Patient requested to send it back via Call Patient to pick up within 5-days. Document is located in providers tray at front office.Please advise at Mobile [phone] (mobile)

## 2023-12-15 NOTE — Telephone Encounter (Signed)
Paperwork completed and in provider box for review

## 2023-12-18 NOTE — Telephone Encounter (Signed)
Faxed to employer and green sheet taken to the front.

## 2024-01-21 ENCOUNTER — Other Ambulatory Visit: Payer: Self-pay | Admitting: Physician Assistant

## 2024-02-16 ENCOUNTER — Other Ambulatory Visit: Payer: Self-pay | Admitting: Physician Assistant

## 2024-02-25 NOTE — Progress Notes (Deleted)
  Start: *** end: *** Patient is here today *** Patient would like to learn *** Patient lives with ***.  *** shopping and cooking.  History includes:  *** Medications include:  *** Labs noted:  ***  Newly dx type 2

## 2024-03-01 ENCOUNTER — Ambulatory Visit: Payer: 59 | Admitting: Dietician

## 2024-03-01 DIAGNOSIS — E119 Type 2 diabetes mellitus without complications: Secondary | ICD-10-CM

## 2024-03-14 ENCOUNTER — Other Ambulatory Visit: Payer: Self-pay | Admitting: Physician Assistant

## 2024-03-15 ENCOUNTER — Ambulatory Visit: Admitting: Dietician

## 2024-04-13 ENCOUNTER — Other Ambulatory Visit: Payer: Self-pay | Admitting: Physician Assistant

## 2024-04-19 ENCOUNTER — Ambulatory Visit: Admitting: Dietician

## 2024-05-09 ENCOUNTER — Other Ambulatory Visit: Payer: Self-pay | Admitting: Physician Assistant

## 2024-05-10 ENCOUNTER — Ambulatory Visit: Admitting: Dietician

## 2024-06-07 ENCOUNTER — Other Ambulatory Visit: Payer: Self-pay | Admitting: Physician Assistant

## 2024-06-16 ENCOUNTER — Other Ambulatory Visit: Payer: Self-pay | Admitting: Cardiovascular Disease

## 2024-06-18 ENCOUNTER — Other Ambulatory Visit: Payer: Self-pay | Admitting: Cardiovascular Disease

## 2024-07-04 ENCOUNTER — Other Ambulatory Visit: Payer: Self-pay | Admitting: Physician Assistant

## 2024-07-05 ENCOUNTER — Ambulatory Visit: Admitting: Dietician

## 2024-07-15 ENCOUNTER — Ambulatory Visit (INDEPENDENT_AMBULATORY_CARE_PROVIDER_SITE_OTHER): Payer: 59 | Admitting: Physician Assistant

## 2024-07-15 ENCOUNTER — Encounter: Payer: Self-pay | Admitting: Physician Assistant

## 2024-07-15 ENCOUNTER — Other Ambulatory Visit: Payer: Self-pay | Admitting: Physician Assistant

## 2024-07-15 VITALS — BP 120/80 | HR 60 | Temp 98.2°F | Ht 69.5 in | Wt 226.2 lb

## 2024-07-15 DIAGNOSIS — Z1159 Encounter for screening for other viral diseases: Secondary | ICD-10-CM | POA: Diagnosis not present

## 2024-07-15 DIAGNOSIS — Z0000 Encounter for general adult medical examination without abnormal findings: Secondary | ICD-10-CM | POA: Diagnosis not present

## 2024-07-15 DIAGNOSIS — E1165 Type 2 diabetes mellitus with hyperglycemia: Secondary | ICD-10-CM | POA: Diagnosis not present

## 2024-07-15 DIAGNOSIS — I214 Non-ST elevation (NSTEMI) myocardial infarction: Secondary | ICD-10-CM

## 2024-07-15 DIAGNOSIS — M24571 Contracture, right ankle: Secondary | ICD-10-CM | POA: Insufficient documentation

## 2024-07-15 DIAGNOSIS — F410 Panic disorder [episodic paroxysmal anxiety] without agoraphobia: Secondary | ICD-10-CM

## 2024-07-15 DIAGNOSIS — E782 Mixed hyperlipidemia: Secondary | ICD-10-CM

## 2024-07-15 LAB — CBC WITH DIFFERENTIAL/PLATELET
Basophils Absolute: 0.1 K/uL (ref 0.0–0.1)
Basophils Relative: 1 % (ref 0.0–3.0)
Eosinophils Absolute: 0.3 K/uL (ref 0.0–0.7)
Eosinophils Relative: 4.4 % (ref 0.0–5.0)
HCT: 46.9 % (ref 39.0–52.0)
Hemoglobin: 16 g/dL (ref 13.0–17.0)
Lymphocytes Relative: 27.5 % (ref 12.0–46.0)
Lymphs Abs: 2.1 K/uL (ref 0.7–4.0)
MCHC: 34 g/dL (ref 30.0–36.0)
MCV: 87.4 fl (ref 78.0–100.0)
Monocytes Absolute: 0.6 K/uL (ref 0.1–1.0)
Monocytes Relative: 7.3 % (ref 3.0–12.0)
Neutro Abs: 4.6 K/uL (ref 1.4–7.7)
Neutrophils Relative %: 59.8 % (ref 43.0–77.0)
Platelets: 184 K/uL (ref 150.0–400.0)
RBC: 5.36 Mil/uL (ref 4.22–5.81)
RDW: 12.9 % (ref 11.5–15.5)
WBC: 7.7 K/uL (ref 4.0–10.5)

## 2024-07-15 LAB — LIPID PANEL
Cholesterol: 122 mg/dL (ref 0–200)
HDL: 42.9 mg/dL (ref >39.00)
LDL Cholesterol: 64 mg/dL (ref 0–99)
NonHDL: 79.32
Total CHOL/HDL Ratio: 3
Triglycerides: 75 mg/dL (ref 0.0–149.0)
VLDL: 15 mg/dL (ref 0.0–40.0)

## 2024-07-15 LAB — MICROALBUMIN / CREATININE URINE RATIO
Creatinine,U: 98 mg/dL
Microalb Creat Ratio: 14.4 mg/g (ref 0.0–30.0)
Microalb, Ur: 1.4 mg/dL (ref 0.0–1.9)

## 2024-07-15 LAB — COMPREHENSIVE METABOLIC PANEL WITH GFR
ALT: 24 U/L (ref 0–53)
AST: 17 U/L (ref 0–37)
Albumin: 4.5 g/dL (ref 3.5–5.2)
Alkaline Phosphatase: 88 U/L (ref 39–117)
BUN: 13 mg/dL (ref 6–23)
CO2: 27 meq/L (ref 19–32)
Calcium: 9.2 mg/dL (ref 8.4–10.5)
Chloride: 106 meq/L (ref 96–112)
Creatinine, Ser: 0.73 mg/dL (ref 0.40–1.50)
GFR: 109.12 mL/min (ref >60.00)
Glucose, Bld: 162 mg/dL — ABNORMAL HIGH (ref 70–99)
Potassium: 4.7 meq/L (ref 3.5–5.1)
Sodium: 139 meq/L (ref 135–145)
Total Bilirubin: 0.5 mg/dL (ref 0.2–1.2)
Total Protein: 7.1 g/dL (ref 6.0–8.3)

## 2024-07-15 LAB — TSH: TSH: 1.64 u[IU]/mL (ref 0.35–5.50)

## 2024-07-15 LAB — HEMOGLOBIN A1C: Hgb A1c MFr Bld: 6.9 % — ABNORMAL HIGH (ref 4.6–6.5)

## 2024-07-15 MED ORDER — CLONAZEPAM 0.5 MG PO TABS: 0.5000 mg | ORAL_TABLET | Freq: Two times a day (BID) | ORAL | 0 refills | Status: AC | PRN

## 2024-07-15 NOTE — Progress Notes (Signed)
 "   Patient ID: Delwyn Scoggin, male    DOB: 1976-02-05, 48 y.o.   MRN: [id number]   Assessment & Plan:  Annual physical exam -     CBC with Differential/Platelet -     Comprehensive metabolic panel with GFR -     Hemoglobin A1c -     Lipid panel -     TSH  Need for hepatitis C screening test -     Hepatitis C antibody  Type 2 diabetes mellitus with hyperglycemia, without long-term current use of insulin (HCC) -     Microalbumin / creatinine urine ratio  Panic attack -     clonazePAM ; Take 1 tablet (0.5 mg total) by mouth 2 (two) times daily as needed for anxiety.  Dispense: 15 tablet; Refill: 0  NSTEMI (non-ST elevated myocardial infarction) (HCC)  Mixed hyperlipidemia     Assessment & Plan Adult Wellness Visit Annual physical examination conducted. No new skin changes or concerning moles. Declined flu vaccination due to dislike of needles and low risk of exposure. Cologuard screening is up to date. Needs eye screening due to history of diabetes. - Perform CBC, CMP, A1c, hep C, lipid panel, TSH, and urine microalbumin tests - Schedule eye screening and ensure records are sent to the office  Type 2 Diabetes Mellitus Diagnosed last year, managed with diet and exercise. Last A1c was 6.5%. No current medication for diabetes. Discussed dietary habits, including consumption of zero sugar sodas and Propel, but noted weakness for bread. - Monitor A1c and glucose levels - Encourage dietary modifications to reduce bread intake Lab Results  Component Value Date   HGBA1C 6.9 (H) 07/15/2024   HGBA1C 6.5 07/15/2023   HGBA1C 6.2 (H) 08/13/2021     Panic Disorder (Panic Attacks) Experiencing panic attacks approximately once a week. Hydroxyzine  was ineffective. Prefers not to use daily anti-anxiety medication. Discussed use of Klonopin  as a rescue medication. Risks of dizziness and potential habit-forming nature of benzodiazepines discussed. Prefers rescue medication over daily use to  avoid disrupting current medication regimen. - Prescribe Klonopin  0.5 mg, take half tablet as needed up to twice daily for acute panic attacks  History of NSTEMI Requires close monitoring of glucose and cholesterol levels. Follow-up with cardiologist is overdue. - Instruct to schedule follow-up appointment with cardiologist  Hyperlipidemia Managed with rosuvastatin  40 mg daily. Initially experienced muscle pain, which resolved after a week. No current side effects reported. - Continue rosuvastatin  40 mg daily - Monitor cholesterol levels      Return in about 4 months (around 11/14/2024) for recheck/follow-up.    Subjective:    Chief Complaint  Patient presents with   Annual Exam    Physical; only concern FMLA to continue therapy; want to discuss alternative for anxiety meds; need options for eye doctors    HPI Discussed the use of AI scribe software for clinical note transcription with the patient, who gave verbal consent to proceed.  History of Present Illness Pellegrino Kennard is a 48 year old male with panic attacks and type 2 diabetes who presents for an annual physical exam.  He experiences panic attacks that occur sporadically, sometimes as frequently as twice in two days, and other times with a gap of a month. Situations at work, such as a recent incident involving a pallet of hot cocoa, trigger these attacks. He has been working with a therapist and practicing breathing techniques, which have helped in some instances. He previously tried hydroxyzine  for anxiety, but found it ineffective. No  chest pain, shortness of breath, nausea, vomiting, diarrhea, or unusual stools. He reports anxiety and panic attacks, which are sometimes accompanied by shortness of breath.  He has a history of type 2 diabetes, diagnosed last year, and manages it with diet and exercise. His last A1c was 6.5%. He drinks zero sugar sodas and Propel water, but admits to a weakness for bread.  He has a  history of NSTEMI and is currently taking rosuvastatin  40 mg daily. He experienced initial muscle pain with the medication, which resolved after a week. He has not followed up with his cardiologist since his last visit, which was over a year ago.  He experiences seasonal skin changes, which he describes as itchy, dry, and rough patches that appear in different locations each year. He uses hydrocortisone cream with some relief. He mentions a mole on his back that was previously evaluated by his cardiologist, who told him it did not look worrisome.  He has not had an eye screening recently due to cost concerns, but plans to return to his previous eye doctor. He works in a job that involves physical activity and has been on light duty due to a recent ankle sprain.     Past Medical History:  Diagnosis Date   Acid reflux    Chest pain 08/11/2021   Coronary artery disease    Hyperlipidemia    Kidney stone    NSTEMI (non-ST elevated myocardial infarction) (HCC) 08/11/2021   Vertigo     Past Surgical History:  Procedure Laterality Date   CARDIAC CATHETERIZATION     CORONARY STENT INTERVENTION N/A 08/13/2021   Procedure: CORONARY STENT INTERVENTION;  Surgeon: Wonda Sharper, MD;  Location: [facility] INVASIVE CV LAB;  Service: Cardiovascular;  Laterality: N/A;   LEFT HEART CATH AND CORONARY ANGIOGRAPHY N/A 08/13/2021   Procedure: LEFT HEART CATH AND CORONARY ANGIOGRAPHY;  Surgeon: Wonda Sharper, MD;  Location: [facility] INVASIVE CV LAB;  Service: Cardiovascular;  Laterality: N/A;   WISDOM TOOTH EXTRACTION     WISDOM TOOTH EXTRACTION      Family History  Problem Relation Age of Onset   Heart attack Father    Pneumonia Father        passed from this   Cirrhosis Father    Diabetes Paternal Grandmother    Heart attack Paternal Grandfather     Social History   Tobacco Use   Smoking status: Former    Current packs/day: 0.00    Types: Cigarettes    Quit date: 08/11/2021    Years since quitting:  2.9   Smokeless tobacco: Never  Vaping Use   Vaping status: Never Used  Substance Use Topics   Alcohol use: Yes    Comment: rare   Drug use: No     Allergies  Allergen Reactions   Azithromycin Other (See Comments)    Caused his tongue to breakout with bumps  Other reaction(s): Other (See Comments) Caused his tongue to breakout with bumps    Caused his tongue to breakout with bumps    Review of Systems NEGATIVE UNLESS OTHERWISE INDICATED IN HPI      Objective:     BP 120/80   Pulse 60   Temp 98.2 F (36.8 C)   Ht 5' 9.5 (1.765 m)   Wt 226 lb 3.2 oz (102.6 kg)   SpO2 100%   BMI 32.92 kg/m   Wt Readings from Last 3 Encounters:  07/15/24 226 lb 3.2 oz (102.6 kg)  12/11/23 228 lb 6.4 oz (103.6  kg)  07/15/23 239 lb 3.2 oz (108.5 kg)    BP Readings from Last 3 Encounters:  07/15/24 120/80  12/11/23 (!) 140/90  07/15/23 130/78     Physical Exam Vitals and nursing note reviewed.  Constitutional:      General: He is not in acute distress.    Appearance: Normal appearance. He is obese. He is not toxic-appearing.  HENT:     Head: Normocephalic and atraumatic.     Right Ear: Tympanic membrane, ear canal and external ear normal.     Left Ear: Tympanic membrane, ear canal and external ear normal.     Nose: Nose normal.     Mouth/Throat:     Mouth: Mucous membranes are moist.     Pharynx: Oropharynx is clear.  Eyes:     Extraocular Movements: Extraocular movements intact.     Conjunctiva/sclera: Conjunctivae normal.     Pupils: Pupils are equal, round, and reactive to light.  Cardiovascular:     Rate and Rhythm: Normal rate and regular rhythm.     Pulses: Normal pulses.     Heart sounds: Normal heart sounds.  Pulmonary:     Effort: Pulmonary effort is normal.     Breath sounds: Normal breath sounds.  Abdominal:     General: Abdomen is flat. Bowel sounds are normal.     Palpations: Abdomen is soft.     Tenderness: There is no abdominal tenderness.   Musculoskeletal:        General: Normal range of motion.     Cervical back: Normal range of motion and neck supple.  Skin:    General: Skin is warm and dry.     Findings: No lesion or rash.  Neurological:     General: No focal deficit present.     Mental Status: He is alert and oriented to person, place, and time.  Psychiatric:        Mood and Affect: Mood normal.        Behavior: Behavior normal.             Cotina Freedman M Kooper Chriswell, PA-C  "

## 2024-07-16 LAB — HEPATITIS C ANTIBODY: Hepatitis C Ab: NONREACTIVE

## 2024-07-16 NOTE — Patient Instructions (Signed)
  VISIT SUMMARY: You had your annual physical exam today. We discussed your panic attacks, type 2 diabetes, history of NSTEMI, and hyperlipidemia. We also talked about your skin changes and the need for an eye screening.  YOUR PLAN: ADULT WELLNESS VISIT: Annual physical examination conducted. No new skin changes or concerning moles. Declined flu vaccination due to dislike of needles and low risk of exposure. Cologuard screening is up to date. Needs eye screening due to history of diabetes. -Perform CBC, CMP, A1c, hep C, lipid panel, TSH, and urine microalbumin tests. -Schedule eye screening and ensure records are sent to the office.  TYPE 2 DIABETES MELLITUS: Diagnosed last year, managed with diet and exercise. Last A1c was 6.5%. No current medication for diabetes. Discussed dietary habits, including consumption of zero sugar sodas and Propel, but noted weakness for bread. -Monitor A1c and glucose levels. -Encourage dietary modifications to reduce bread intake.  PANIC DISORDER (PANIC ATTACKS): Experiencing panic attacks approximately once a week. Hydroxyzine  was ineffective. Prefers not to use daily anti-anxiety medication. Discussed use of Klonopin  as a rescue medication. Risks of dizziness and potential habit-forming nature of benzodiazepines discussed. Prefers rescue medication over daily use to avoid disrupting current medication regimen. -Prescribe Klonopin  0.5 mg, take half tablet as needed up to twice daily for acute panic attacks.  HISTORY OF NSTEMI: Requires close monitoring of glucose and cholesterol levels. Follow-up with cardiologist is overdue. -Instruct to schedule follow-up appointment with cardiologist.  HYPERLIPIDEMIA: Managed with rosuvastatin  40 mg daily. Initially experienced muscle pain, which resolved after a week. No current side effects reported. -Continue rosuvastatin  40 mg daily. -Monitor cholesterol levels.                      Contains text  generated by [employer].                                 Contains text generated by [employer].

## 2024-07-18 ENCOUNTER — Ambulatory Visit: Payer: Self-pay | Admitting: Physician Assistant

## 2024-07-19 ENCOUNTER — Other Ambulatory Visit: Payer: Self-pay

## 2024-07-19 DIAGNOSIS — E1165 Type 2 diabetes mellitus with hyperglycemia: Secondary | ICD-10-CM

## 2024-07-19 MED ORDER — METFORMIN HCL ER 500 MG PO TB24: 500.0000 mg | ORAL_TABLET | Freq: Every day | ORAL | 0 refills | Status: DC

## 2024-08-09 ENCOUNTER — Other Ambulatory Visit: Payer: Self-pay | Admitting: Physician Assistant

## 2024-09-07 ENCOUNTER — Other Ambulatory Visit: Payer: Self-pay | Admitting: Physician Assistant

## 2024-09-30 NOTE — Progress Notes (Signed)
 Cardiology Office Note:  .   Date:  10/01/2024  ID:  Kevin Lloyd, DOB 22-Mar-1976, MRN [id number] PCP: Kathrene Mardy HERO, PA-C  [facility] Providers Cardiologist:  Darryle ONEIDA Decent, MD   History of Present Illness: .    Chief Complaint  Patient presents with   Follow-up         Kevin Lloyd is a 48 y.o. male with below history who presents for follow-up.   History of Present Illness   Kevin Lloyd is a 48 year old male with a history of non-STEMI who presents for a follow-up visit.  He has a history of non-STEMI in 2022 and underwent PCI to the OM2. He has a 70% RPDA lesion and a 100% PLV lesion that have been managed medically. He does not report chest discomfort. His most recent LDL level is 64, and he is on Crestor  (rosuvastatin ) and aspirin  therapy.  He has type 2 diabetes and is currently on metformin . The metformin  is effective, and he has lost approximately 20 pounds, going from 230 to 210 pounds. The weight loss is gradual, and he feels lighter. He is concerned about running out of metformin  and requests a refill.  He is physically active at work, although not through conventional exercise routines. No chest discomfort, trouble breathing, or swelling in his legs.  He experiences panic attacks, which are being managed with the help of his therapist and Alyssa, and these are improving.           Problem List CAD/NSTEMI -07/2021 -95% OM2 -> PCI -100% RPLV -> Med management -70% RPDA -> Med management  2. HLD -T chol 122, HDL 43, LDL 64, TG 75 -Lpa 143 3. HTN 4. DM -A1c 6.9    ROS: All other ROS reviewed and negative. Pertinent positives noted in the HPI.     Studies Reviewed: SABRA   EKG Interpretation Date/Time:  Friday October 01 2024 15:35:40 EST Ventricular Rate:  61 PR Interval:  174 QRS Duration:  92 QT Interval:  396 QTC Calculation: 398 R Axis:   -25  Text Interpretation: Normal sinus rhythm Normal ECG Confirmed by Decent Darryle  [phone]) on 10/01/2024 3:42:12 PM   TTE 08/13/2021 \ 1. Left ventricular ejection fraction, by estimation, is 50 to 55%. The  left ventricle has low normal function. The left ventricle has no regional  wall motion abnormalities. Left ventricular diastolic parameters are  consistent with Grade I diastolic  dysfunction (impaired relaxation).   2. Right ventricular systolic function is normal. The right ventricular  size is normal.   3. The mitral valve is normal in structure. No evidence of mitral valve  regurgitation. No evidence of mitral stenosis.   4. The aortic valve is normal in structure. Aortic valve regurgitation is  not visualized. No aortic stenosis is present.   5. The inferior vena cava is normal in size with greater than 50%  respiratory variability, suggesting right atrial pressure of 3 mmHg.  Physical Exam:   VS:  BP 126/76   Pulse 61   Ht 5' 9 (1.753 m)   Wt 211 lb (95.7 kg)   SpO2 99%   BMI 31.16 kg/m    Wt Readings from Last 3 Encounters:  10/01/24 211 lb (95.7 kg)  07/15/24 226 lb 3.2 oz (102.6 kg)  12/11/23 228 lb 6.4 oz (103.6 kg)    GEN: Well nourished, well developed in no acute distress NECK: No JVD; No carotid bruits CARDIAC: RRR, no murmurs, rubs, gallops RESPIRATORY:  Clear to auscultation without rales, wheezing or rhonchi  ABDOMEN: Soft, non-tender, non-distended EXTREMITIES:  No edema; No deformity  ASSESSMENT AND PLAN: .   Assessment and Plan    Coronary artery disease with history of non-STEMI and stable angina Non-STEMI in 2022 with PCI to OM2. Asymptomatic, normal EKG, LDL at goal. - Continue aspirin  indefinitely. - Continue Crestor  40 mg daily. - Monitor for symptoms of angina or dyspnea and report if he occurs.  Type 2 diabetes mellitus Recently diagnosed, A1c 6.9, weight loss indicates improved control. - Refilled metformin  prescription. - Follow up with primary care physician in January for diabetes management.  Mixed  hyperlipidemia LDL at goal with current treatment. - Continue rosuvastatin  as prescribed.  Primary hypertension Blood pressure well-controlled.                Follow-up: Return in about 1 year (around 10/01/2025).  Signed, Darryle DASEN. Barbaraann, MD, [facility]  [facility]  [address] [place], [place] [address] [phone]  3:51 PM

## 2024-10-01 ENCOUNTER — Other Ambulatory Visit (HOSPITAL_COMMUNITY): Payer: Self-pay

## 2024-10-01 ENCOUNTER — Ambulatory Visit: Attending: Cardiovascular Disease | Admitting: Cardiovascular Disease

## 2024-10-01 ENCOUNTER — Encounter: Payer: Self-pay | Admitting: Cardiovascular Disease

## 2024-10-01 VITALS — BP 126/76 | HR 61 | Ht 69.0 in | Wt 211.0 lb

## 2024-10-01 DIAGNOSIS — E119 Type 2 diabetes mellitus without complications: Secondary | ICD-10-CM | POA: Diagnosis not present

## 2024-10-01 DIAGNOSIS — E1165 Type 2 diabetes mellitus with hyperglycemia: Secondary | ICD-10-CM

## 2024-10-01 DIAGNOSIS — I10 Essential (primary) hypertension: Secondary | ICD-10-CM | POA: Diagnosis not present

## 2024-10-01 DIAGNOSIS — E782 Mixed hyperlipidemia: Secondary | ICD-10-CM

## 2024-10-01 DIAGNOSIS — I25118 Atherosclerotic heart disease of native coronary artery with other forms of angina pectoris: Secondary | ICD-10-CM | POA: Diagnosis not present

## 2024-10-01 MED ORDER — METFORMIN HCL ER 500 MG PO TB24
500.0000 mg | ORAL_TABLET | Freq: Every day | ORAL | 3 refills | Status: AC
  Filled 2024-10-01: qty 90, 90d supply, fill #0

## 2024-10-01 NOTE — Patient Instructions (Signed)
 Medication Instructions:  Continue all medications *If you need a refill on your cardiac medications before your next appointment, please call your pharmacy*  Lab Work: None ordered  Testing/Procedures: None ordered  Follow-Up: At [facility], you and your health needs are our priority.  As part of our continuing mission to provide you with exceptional heart care, our providers are all part of one team.  This team includes your primary Cardiologist (physician) and Advanced Practice Providers or APPs (Physician Assistants and Nurse Practitioners) who all work together to provide you with the care you need, when you need it.  Your next appointment:  1 Year    Call in August to schedule Dec appointment    Provider: PA   We recommend signing up for the patient portal called MyChart.  Sign up information is provided on this After Visit Summary.  MyChart is used to connect with patients for Virtual Visits (Telemedicine).  Patients are able to view lab/test results, encounter notes, upcoming appointments, etc.  Non-urgent messages can be sent to your provider as well.   To learn more about what you can do with MyChart, go to [email or web address].

## 2024-10-08 ENCOUNTER — Other Ambulatory Visit: Payer: Self-pay | Admitting: Physician Assistant

## 2024-10-13 ENCOUNTER — Other Ambulatory Visit: Payer: Self-pay | Admitting: Physician Assistant

## 2024-10-13 DIAGNOSIS — E1165 Type 2 diabetes mellitus with hyperglycemia: Secondary | ICD-10-CM

## 2024-11-11 ENCOUNTER — Ambulatory Visit: Admitting: Physician Assistant

## 2024-11-12 ENCOUNTER — Other Ambulatory Visit: Payer: Self-pay | Admitting: Physician Assistant

## 2024-11-15 ENCOUNTER — Ambulatory Visit: Admitting: Physician Assistant

## 2024-11-29 ENCOUNTER — Ambulatory Visit: Admitting: Physician Assistant

## 2024-12-20 ENCOUNTER — Ambulatory Visit: Admitting: Physician Assistant
